# Patient Record
Sex: Male | Born: 1945 | Race: White | Hispanic: No | Marital: Married | Smoking: Never smoker
Health system: Southern US, Community
[De-identification: ages and names within clinical notes are randomized; demographics above are authoritative.]

## PROBLEM LIST (undated history)

## (undated) DIAGNOSIS — E785 Hyperlipidemia, unspecified: Secondary | ICD-10-CM

## (undated) DIAGNOSIS — I10 Essential (primary) hypertension: Secondary | ICD-10-CM

## (undated) DIAGNOSIS — L989 Disorder of the skin and subcutaneous tissue, unspecified: Secondary | ICD-10-CM

## (undated) HISTORY — PX: SKIN CANCER EXCISION: SHX779

## (undated) HISTORY — DX: Disorder of the skin and subcutaneous tissue, unspecified: L98.9

## (undated) HISTORY — DX: Essential (primary) hypertension: I10

## (undated) HISTORY — DX: Hyperlipidemia, unspecified: E78.5

---

## 2004-02-07 LAB — HM COLONOSCOPY

## 2004-06-20 ENCOUNTER — Ambulatory Visit: Payer: Self-pay | Admitting: Family Medicine

## 2004-06-28 ENCOUNTER — Ambulatory Visit: Payer: Self-pay | Admitting: Family Medicine

## 2005-07-08 ENCOUNTER — Ambulatory Visit: Payer: Self-pay | Admitting: Family Medicine

## 2005-07-15 ENCOUNTER — Ambulatory Visit: Payer: Self-pay | Admitting: Family Medicine

## 2006-07-08 ENCOUNTER — Ambulatory Visit: Payer: Self-pay | Admitting: Family Medicine

## 2006-07-08 LAB — CONVERTED CEMR LAB
AST: 36 units/L (ref 0–37)
Albumin: 4 g/dL (ref 3.5–5.2)
Basophils Relative: 0.9 % (ref 0.0–1.0)
CO2: 32 meq/L (ref 19–32)
Chloride: 100 meq/L (ref 96–112)
Cholesterol: 160 mg/dL (ref 0–200)
Creatinine, Ser: 1.1 mg/dL (ref 0.4–1.5)
Eosinophils Relative: 2.4 % (ref 0.0–5.0)
Glucose, Bld: 94 mg/dL (ref 70–99)
HCT: 39.3 % (ref 39.0–52.0)
Hemoglobin: 13.7 g/dL (ref 13.0–17.0)
LDL Cholesterol: 102 mg/dL — ABNORMAL HIGH (ref 0–99)
MCHC: 34.9 g/dL (ref 30.0–36.0)
Monocytes Absolute: 0.7 10*3/uL (ref 0.2–0.7)
Neutrophils Relative %: 39.8 % — ABNORMAL LOW (ref 43.0–77.0)
PSA: 0.33 ng/mL (ref 0.10–4.00)
Potassium: 3.7 meq/L (ref 3.5–5.1)
RBC: 4.38 M/uL (ref 4.22–5.81)
RDW: 12.7 % (ref 11.5–14.6)
Sodium: 139 meq/L (ref 135–145)
TSH: 1.25 microintl units/mL (ref 0.35–5.50)
Total Bilirubin: 1.2 mg/dL (ref 0.3–1.2)
Total CHOL/HDL Ratio: 4.2
Total Protein: 6.8 g/dL (ref 6.0–8.3)
VLDL: 20 mg/dL (ref 0–40)
WBC: 5.1 10*3/uL (ref 4.5–10.5)

## 2006-07-15 ENCOUNTER — Ambulatory Visit: Payer: Self-pay | Admitting: Family Medicine

## 2006-12-17 DIAGNOSIS — I10 Essential (primary) hypertension: Secondary | ICD-10-CM

## 2006-12-17 DIAGNOSIS — E785 Hyperlipidemia, unspecified: Secondary | ICD-10-CM | POA: Insufficient documentation

## 2007-06-25 ENCOUNTER — Telehealth: Payer: Self-pay | Admitting: Family Medicine

## 2007-07-16 ENCOUNTER — Ambulatory Visit: Payer: Self-pay | Admitting: Family Medicine

## 2007-07-16 LAB — CONVERTED CEMR LAB
Ketones, urine, test strip: NEGATIVE
Nitrite: NEGATIVE
Specific Gravity, Urine: 1.015
Urobilinogen, UA: 0.2
WBC Urine, dipstick: NEGATIVE

## 2007-07-17 LAB — CONVERTED CEMR LAB
ALT: 41 U/L
AST: 41 U/L — ABNORMAL HIGH
Albumin: 4.2 g/dL
Alkaline Phosphatase: 14 U/L — ABNORMAL LOW
BUN: 20 mg/dL
Basophils Absolute: 0 K/uL
Basophils Relative: 0.1 %
Bilirubin, Direct: 0.2 mg/dL
CO2: 28 meq/L
Calcium: 9.6 mg/dL
Chloride: 100 meq/L
Cholesterol: 148 mg/dL
Creatinine, Ser: 1.2 mg/dL
Eosinophils Absolute: 0.1 K/uL
Eosinophils Relative: 2.1 %
GFR calc Af Amer: 79 mL/min
GFR calc non Af Amer: 65 mL/min
Glucose, Bld: 92 mg/dL
HCT: 40.3 %
HDL: 32.6 mg/dL — ABNORMAL LOW
Hemoglobin: 13.3 g/dL
LDL Cholesterol: 103 mg/dL — ABNORMAL HIGH
Lymphocytes Relative: 43.4 %
MCHC: 33 g/dL
MCV: 92 fL
Monocytes Absolute: 0.7 K/uL
Monocytes Relative: 14.9 % — ABNORMAL HIGH
Neutro Abs: 1.9 K/uL
Neutrophils Relative %: 39.5 % — ABNORMAL LOW
PSA: 0.34 ng/mL
Platelets: 194 K/uL
Potassium: 4.6 meq/L
RBC: 4.38 M/uL
RDW: 13 %
Sodium: 137 meq/L
TSH: 1.81 u[IU]/mL
Total Bilirubin: 0.9 mg/dL
Total CHOL/HDL Ratio: 4.5
Total Protein: 6.7 g/dL
Triglycerides: 61 mg/dL
VLDL: 12 mg/dL
WBC: 4.7 10*3/microliter

## 2007-07-24 ENCOUNTER — Ambulatory Visit: Payer: Self-pay | Admitting: Family Medicine

## 2008-06-10 ENCOUNTER — Telehealth: Payer: Self-pay | Admitting: Family Medicine

## 2008-07-11 ENCOUNTER — Ambulatory Visit: Payer: Self-pay | Admitting: Family Medicine

## 2008-07-11 LAB — CONVERTED CEMR LAB
Blood in Urine, dipstick: NEGATIVE
Glucose, Urine, Semiquant: NEGATIVE
Nitrite: NEGATIVE
Protein, U semiquant: NEGATIVE
Urobilinogen, UA: 0.2
WBC Urine, dipstick: NEGATIVE
pH: 6

## 2008-07-15 LAB — CONVERTED CEMR LAB
AST: 42 units/L — ABNORMAL HIGH (ref 0–37)
Alkaline Phosphatase: 14 units/L — ABNORMAL LOW (ref 39–117)
BUN: 24 mg/dL — ABNORMAL HIGH (ref 6–23)
Basophils Absolute: 0 10*3/uL (ref 0.0–0.1)
Chloride: 104 meq/L (ref 96–112)
Eosinophils Absolute: 0.1 10*3/uL (ref 0.0–0.7)
Eosinophils Relative: 2.1 % (ref 0.0–5.0)
GFR calc non Af Amer: 80 mL/min
HDL: 34.8 mg/dL — ABNORMAL LOW (ref >39.0)
LDL Cholesterol: 112 mg/dL — ABNORMAL HIGH (ref 0–99)
MCV: 91.8 fL (ref 78.0–100.0)
Neutrophils Relative %: 39.1 % — ABNORMAL LOW (ref 43.0–77.0)
Platelets: 203 10*3/uL (ref 150–400)
Potassium: 3.8 meq/L (ref 3.5–5.1)
Total Bilirubin: 0.9 mg/dL (ref 0.3–1.2)
VLDL: 18 mg/dL (ref 0–40)
WBC: 5.6 10*3/uL (ref 4.5–10.5)

## 2008-07-18 ENCOUNTER — Ambulatory Visit: Payer: Self-pay | Admitting: Family Medicine

## 2008-07-18 DIAGNOSIS — M7710 Lateral epicondylitis, unspecified elbow: Secondary | ICD-10-CM

## 2009-07-03 ENCOUNTER — Telehealth: Payer: Self-pay | Admitting: Family Medicine

## 2009-07-19 ENCOUNTER — Ambulatory Visit: Payer: Self-pay | Admitting: Family Medicine

## 2009-07-21 ENCOUNTER — Telehealth: Payer: Self-pay | Admitting: Family Medicine

## 2009-08-21 ENCOUNTER — Ambulatory Visit: Payer: Self-pay | Admitting: Family Medicine

## 2009-08-21 DIAGNOSIS — M25569 Pain in unspecified knee: Secondary | ICD-10-CM

## 2009-10-05 ENCOUNTER — Telehealth (INDEPENDENT_AMBULATORY_CARE_PROVIDER_SITE_OTHER): Payer: Self-pay

## 2009-11-13 ENCOUNTER — Encounter (INDEPENDENT_AMBULATORY_CARE_PROVIDER_SITE_OTHER): Payer: Self-pay | Admitting: *Deleted

## 2009-12-11 ENCOUNTER — Encounter (INDEPENDENT_AMBULATORY_CARE_PROVIDER_SITE_OTHER): Payer: Self-pay | Admitting: *Deleted

## 2009-12-14 ENCOUNTER — Ambulatory Visit: Payer: Self-pay | Admitting: Internal Medicine

## 2009-12-28 ENCOUNTER — Ambulatory Visit: Payer: Self-pay | Admitting: Internal Medicine

## 2009-12-28 HISTORY — PX: OTHER SURGICAL HISTORY: SHX169

## 2009-12-28 LAB — HM COLONOSCOPY

## 2010-01-05 ENCOUNTER — Ambulatory Visit: Payer: Self-pay | Admitting: Family Medicine

## 2010-01-09 ENCOUNTER — Encounter: Payer: Self-pay | Admitting: Family Medicine

## 2010-05-31 ENCOUNTER — Telehealth (INDEPENDENT_AMBULATORY_CARE_PROVIDER_SITE_OTHER): Payer: Self-pay | Admitting: *Deleted

## 2010-05-31 ENCOUNTER — Telehealth: Payer: Self-pay

## 2010-06-24 LAB — CONVERTED CEMR LAB
ALT: 37 units/L (ref 0–53)
BUN: 21 mg/dL (ref 6–23)
Basophils Absolute: 0 10*3/uL (ref 0.0–0.1)
Bilirubin Urine: NEGATIVE
Bilirubin, Direct: 0.2 mg/dL (ref 0.0–0.3)
Chloride: 104 meq/L (ref 96–112)
Cholesterol: 152 mg/dL (ref 0–200)
Creatinine, Ser: 1 mg/dL (ref 0.4–1.5)
Eosinophils Relative: 3.1 % (ref 0.0–5.0)
GFR calc non Af Amer: 79.69 mL/min (ref >60)
Ketones, urine, test strip: NEGATIVE
LDL Cholesterol: 91 mg/dL (ref 0–99)
MCV: 95.1 fL (ref 78.0–100.0)
Monocytes Absolute: 0.6 10*3/uL (ref 0.1–1.0)
Neutrophils Relative %: 42.1 % — ABNORMAL LOW (ref 43.0–77.0)
PSA: 0.56 ng/mL (ref 0.10–4.00)
Platelets: 198 10*3/uL (ref 150.0–400.0)
Protein, U semiquant: NEGATIVE
Total Bilirubin: 0.8 mg/dL (ref 0.3–1.2)
Triglycerides: 83 mg/dL (ref 0.0–149.0)
Urobilinogen, UA: 0.2
VLDL: 16.6 mg/dL (ref 0.0–40.0)
WBC: 4.4 10*3/uL — ABNORMAL LOW (ref 4.5–10.5)

## 2010-06-26 NOTE — Assessment & Plan Note (Signed)
Summary: fu on knee/njr   Vital Signs:  Patient profile:   65 year old male Weight:      192 pounds BMI:     29.52 BP sitting:   172 / 84  (left arm) Cuff size:   regular  Vitals Entered By: Raechel Ache, RN (January 05, 2010 9:59 AM) CC: F/u L knee pain- little worse.   History of Present Illness: Here for continuing pain and swelling in the left knee, primarily on the medial side. This has been going on for 5 months. Etodolac and Advil help but only briefly.   Allergies: No Known Drug Allergies  Past History:  Past Medical History: Reviewed history from 07/18/2008 and no changes required. Hyperlipidemia Hypertension sees  Dr. Danella Deis for skin checks  Past Surgical History: Reviewed history from 07/24/2007 and no changes required. Colonoscopy-02/07/2004, per Dr. Juanda Chance, repeat 5 yrs Skin cancer removed rt cheek  Review of Systems  The patient denies anorexia, fever, weight loss, weight gain, vision loss, decreased hearing, hoarseness, chest pain, syncope, dyspnea on exertion, peripheral edema, prolonged cough, headaches, hemoptysis, abdominal pain, melena, hematochezia, severe indigestion/heartburn, hematuria, incontinence, genital sores, muscle weakness, suspicious skin lesions, transient blindness, difficulty walking, depression, unusual weight change, abnormal bleeding, enlarged lymph nodes, angioedema, breast masses, and testicular masses.    Physical Exam  General:  Well-developed,well-nourished,in no acute distress; alert,appropriate and cooperative throughout examination Extremities:  the left knee is puffy and tender at the medial joint space. Lots of crepitus. Full ROM   Impression & Recommendations:  Problem # 1:  KNEE PAIN (ICD-719.46)  His updated medication list for this problem includes:    Aspirin 81 Mg Tbec (Aspirin) ..... One by mouth every day    Etodolac 500 Mg Tabs (Etodolac) .Marland Kitchen..Marland Kitchen Two times a day  Orders: Orthopedic Surgeon Referral (Ortho  Surgeon)  Complete Medication List: 1)  Diovan Hct 320-25 Mg Tabs (Valsartan-hydrochlorothiazide) .... Take 1 tab daily 2)  Tricor 145 Mg Tabs (Fenofibrate) .Marland Kitchen.. 1 by mouth once daily 3)  Norvasc 5 Mg Tabs (Amlodipine besylate) .Marland Kitchen.. 1 by mouth once daily 4)  Fish Oil Oil (Fish oil) .Marland Kitchen.. 1 by mouth once daily 5)  Multivitamins Tabs (Multiple vitamin) .Marland Kitchen.. 1 by mouth once daily 6)  Aspirin 81 Mg Tbec (Aspirin) .... One by mouth every day 7)  Lipitor 20 Mg Tabs (Atorvastatin calcium) .... Once daily 8)  Etodolac 500 Mg Tabs (Etodolac) .... Two times a day  Patient Instructions: 1)  Will refer him to Orthopedics.

## 2010-06-26 NOTE — Procedures (Signed)
Summary: Colonoscopy  Patient: [name] Note: All result statuses are Final unless otherwise noted.  Tests: (1) Colonoscopy (COL)   COL Colonoscopy           DONE     [facility]     [address]     [place], [place]  [address]           COLONOSCOPY PROCEDURE REPORT           PATIENT:  [name], [name]  MR#:  [id number]     BIRTHDATE:  May 01, 1946, 64 yrs. old  GENDER:  male     ENDOSCOPIST:  Hedwig Morton. Juanda Chance, MD     REF. BY:     PROCEDURE DATE:  12/28/2009     PROCEDURE:  Colonoscopy 29562     ASA CLASS:  Class II     INDICATIONS:  surveillance     MEDICATIONS:   Versed 5 mg, Fentanyl 50 mcg           DESCRIPTION OF PROCEDURE:   After the risks benefits and     alternatives of the procedure were thoroughly explained, informed     consent was obtained.  No rectal exam performed. The LB PCF-H180AL     [id number] endoscope was introduced through the anus and advanced to     the cecum, which was identified by both the appendix and ileocecal     valve, without limitations.  The quality of the prep was good,     using MiraLax.  The instrument was then slowly withdrawn as the     colon was fully examined.     <<PROCEDUREIMAGES>>           FINDINGS:  Mild diverticulosis was found in the sigmoid colon (see     image1).  This was otherwise a normal examination of the colon     (see image2, image3, image4, and image8).   Retroflexed views in     the rectum revealed no abnormalities.    The scope was then     withdrawn from the patient and the procedure completed.           COMPLICATIONS:  None     ENDOSCOPIC IMPRESSION:     1) Mild diverticulosis in the sigmoid colon     2) Otherwise normal examination     RECOMMENDATIONS:     1) high fiber diet     REPEAT EXAM:  In 10 year(s) for.           ______________________________     Hedwig Morton. Juanda Chance, MD           CC:           n.     eSIGNED:   Hedwig Morton. [name] at 12/28/2009 10:33 AM           Mickel Baas, [id number]  Note:  An exclamation mark (!) indicates a result that was not dispersed into the flowsheet. Document Creation Date: 12/28/2009 10:34 AM _______________________________________________________________________  (1) Order result status: Final Collection or observation date-time: 12/28/2009 10:21 Requested date-time:  Receipt date-time:  Reported date-time:  Referring Physician:   Ordering Physician: Lina Sar [id number]) Specimen Source:  Source: Launa Grill Order Number: [id number] Lab site:   Appended Document: Colonoscopy    Clinical Lists Changes  Observations: Added new observation of COLONNXTDUE: 12/2019 (12/28/2009 14:06)

## 2010-06-26 NOTE — Miscellaneous (Signed)
Summary: direct colon//hx of polyps--ch.  Clinical Lists Changes  Medications: Added new medication of MIRALAX   POWD (POLYETHYLENE GLYCOL 3350) As directed - Signed Added new medication of REGLAN 10 MG  TABS (METOCLOPRAMIDE HCL) As directed - Signed Added new medication of DULCOLAX 5 MG  TBEC (BISACODYL) As directed - Signed Rx of MIRALAX   POWD (POLYETHYLENE GLYCOL 3350) As directed;  #255 x 0;  Signed;  Entered by: Clide Cliff RN;  Authorized by: Hart Carwin MD;  Method used: [facility]  [phone]*, [address] [place], [place]  [address], Ph: [phone] or [phone], Fax: [phone] Rx of REGLAN 10 MG  TABS (METOCLOPRAMIDE HCL) As directed;  #2 x 0;  Signed;  Entered by: Clide Cliff RN;  Authorized by: Hart Carwin MD;  Method used: Electronically to [facility]  [phone]*, [address], [place], [place]  [address], Ph: [phone] or [phone], Fax: [phone] Rx of DULCOLAX 5 MG  TBEC (BISACODYL) As directed;  #4 x 0;  Signed;  Entered by: Clide Cliff RN;  Authorized by: Hart Carwin MD;  Method used: Electronically to [facility]  [phone]*, [address], [place], [place]  [address], Ph: [phone] or [phone], Fax: [phone] Observations: Added new observation of ALLERGY REV: Done (12/14/2009 8:21)    Prescriptions: DULCOLAX 5 MG  TBEC (BISACODYL) As directed  #4 x 0   Entered by:   Clide Cliff RN   Authorized by:   Hart Carwin MD   Signed by:   Clide Cliff RN on 12/14/2009   Method used:   Electronically to        [facility]  [phone]* (retail)       [address] [place], [place]  [address]       Ph: [phone] or [phone]       Fax: [phone]   RxID:   [id number] REGLAN 10 MG  TABS (METOCLOPRAMIDE HCL) As directed  #2 x 0   Entered by:   Clide Cliff RN   Authorized by:   Hart Carwin MD   Signed by:   Clide Cliff RN on 12/14/2009   Method used:   Electronically to      [facility]  [phone]* (retail)       [address] [place], [place]  [address]       Ph: [phone] or [phone]       Fax: [phone]   RxID:   [id number] MIRALAX   POWD (POLYETHYLENE GLYCOL 3350) As directed  #255 x 0   Entered by:   Clide Cliff RN   Authorized by:   Hart Carwin MD   Signed by:   Clide Cliff RN on 12/14/2009   Method used:   Electronically to        [facility]  [phone]* (retail)       [address] [place], [place]  [address]       Ph: [phone] or [phone]       Fax: [phone]   RxID:   [id number]

## 2010-06-26 NOTE — Progress Notes (Signed)
Summary: REQ FOR LAB RESULTS  Phone Note Call from Patient   Caller: Patient [phone] Reason for Call: Talk to Nurse Summary of Call: Pt called to obtain results of labwork..... Request return call at [phone].  Initial call taken by: Debbra Riding,  July 21, 2009 1:57 PM  Follow-up for Phone Call        pt aware of normal lab results Follow-up by: Alfred Levins, CMA,  July 21, 2009 2:46 PM

## 2010-06-26 NOTE — Letter (Signed)
Summary: Previsit letter  [facility]  [address] [place], [place] [address]   Phone: [phone]  Fax: [phone]       11/13/2009 MRN: [id number]  [facility] [address] [place], [place]  [address]  Dear [name],  Welcome to the Gastroenterology Division at [facility].    You are scheduled to see a nurse for your pre-procedure visit on 12-14-09 at 8:30a.m. on the 3rd floor at [facility], [address]  We ask that you try to arrive at our office 15 minutes prior to your appointment time to allow for check-in.  Your nurse visit will consist of discussing your medical and surgical history, your immediate family medical history, and your medications.    Please bring a complete list of all your medications or, if you prefer, bring the medication bottles and we will list them.  We will need to be aware of both prescribed and over the counter drugs.  We will need to know exact dosage information as well.  If you are on blood thinners (Coumadin, Plavix, Aggrenox, Ticlid, etc.) please call our office today/prior to your appointment, as we need to consult with your physician about holding your medication.   Please be prepared to read and sign documents such as consent forms, a financial agreement, and acknowledgement forms.  If necessary, and with your consent, a friend or relative is welcome to sit-in on the nurse visit with you.  Please bring your insurance card so that we may make a copy of it.  If your insurance requires a referral to see a specialist, please bring your referral form from your primary care physician.  No co-pay is required for this nurse visit.     If you cannot keep your appointment, please call [phone] to cancel or reschedule prior to your appointment date.  This allows [place] the opportunity to schedule an appointment for another patient in need of care.    Thank you for choosing [facility] for your medical  needs.  We appreciate the opportunity to care for you.  Please visit [place] at our website  to learn more about our practice.                     Sincerely.                                                                                                                   The Gastroenterology Division

## 2010-06-26 NOTE — Progress Notes (Signed)
Summary: AAA Referall  Phone Note Call from Patient Call back at Home Phone [phone]   Caller: Spouse - Joann Summary of Call: Pt reading through Medicare paperwork and sees that Welcome to Medicare Exam will cover a referrall for an abdominal aortic aneurysm ultrasound for at risk patients.  Does pt need this referall?  Is he at risk? Initial call taken by: Trixie Dredge,  Oct 05, 2009 4:47 PM  Follow-up for Phone Call        his only risk factor would be HTN, but we could talk about this. Set up an OV next week to discuss this (not a Medicare Wellness visit) Follow-up by: Nelwyn Salisbury MD,  Oct 06, 2009 8:40 AM  Additional Follow-up for Phone Call Additional follow up Details #1::        attempted to call back - ans mach - LMTCB and make appt - f/u  - not a medicare wellness visit - to discuss. KIK Additional Follow-up by: Duard Brady LPN,  Oct 06, 2009 1:40 PM

## 2010-06-26 NOTE — Assessment & Plan Note (Signed)
Summary: Pt injured lft knee/swelling/pain/cjr   Vital Signs:  Patient profile:   65 year old male Weight:      195 pounds Temp:     98.3 degrees F oral BP sitting:   142 / 88  (left arm) Cuff size:   regular  Vitals Entered By: Raechel Ache, RN (August 21, 2009 11:54 AM) CC: C/o L knee pain and swelling.   History of Present Illness: Here for 3 weeks of pain in the left knee with no trauma hx. He has been running for exercise for several years, averaging around 3 miles a day. The pain is worst at the end of the day, especially if he has been standing or walking a lot. The knee swells at times. Using Advil.   Allergies (verified): No Known Drug Allergies  Past History:  Past Medical History: Reviewed history from 07/18/2008 and no changes required. Hyperlipidemia Hypertension sees  Dr. Danella Deis for skin checks  Past Surgical History: Reviewed history from 07/24/2007 and no changes required. Colonoscopy-02/07/2004, per Dr. Juanda Chance, repeat 5 yrs Skin cancer removed rt cheek  Review of Systems  The patient denies anorexia, fever, weight loss, weight gain, vision loss, decreased hearing, hoarseness, chest pain, syncope, dyspnea on exertion, peripheral edema, prolonged cough, headaches, hemoptysis, abdominal pain, melena, hematochezia, severe indigestion/heartburn, hematuria, incontinence, genital sores, muscle weakness, suspicious skin lesions, transient blindness, difficulty walking, depression, unusual weight change, abnormal bleeding, enlarged lymph nodes, angioedema, breast masses, and testicular masses.    Physical Exam  General:  Well-developed,well-nourished,in no acute distress; alert,appropriate and cooperative throughout examination Msk:  tender around the medial and lateral edges of the left patella, no edema. Full ROM. No crepitus. Negative McMurrays and anterior drawer.    Impression & Recommendations:  Problem # 1:  KNEE PAIN (ICD-719.46)  His updated medication  list for this problem includes:    Aspirin 81 Mg Tbec (Aspirin) ..... One by mouth every day    Etodolac 500 Mg Tabs (Etodolac) .Marland Kitchen..Marland Kitchen Two times a day  Complete Medication List: 1)  Diovan Hct 320-25 Mg Tabs (Valsartan-hydrochlorothiazide) .... Take 1 tab daily 2)  Tricor 145 Mg Tabs (Fenofibrate) .Marland Kitchen.. 1 by mouth once daily 3)  Norvasc 5 Mg Tabs (Amlodipine besylate) .Marland Kitchen.. 1 by mouth once daily 4)  Fish Oil Oil (Fish oil) .Marland Kitchen.. 1 by mouth once daily 5)  Multivitamins Tabs (Multiple vitamin) .Marland Kitchen.. 1 by mouth once daily 6)  Aspirin 81 Mg Tbec (Aspirin) .... One by mouth every day 7)  Lipitor 20 Mg Tabs (Atorvastatin calcium) .... Once daily 8)  Etodolac 500 Mg Tabs (Etodolac) .... Two times a day  Patient Instructions: 1)  This is consistent with patello-femoral syndrome. Stop all running for awhile. Use Etodolac and ice as needed .  2)  Please schedule a follow-up appointment as needed .  Prescriptions: ETODOLAC 500 MG TABS (ETODOLAC) two times a day  #60 x 2   Entered and Authorized by:   Nelwyn Salisbury MD   Signed by:   Nelwyn Salisbury MD on 08/21/2009   Method used:   Electronically to        [facility]  [phone]* (retail)       [address] [place], [place]  [address]       Ph: [phone] or [phone]       Fax: [phone]   RxID:   [id number]

## 2010-06-26 NOTE — Progress Notes (Signed)
Summary: Pt req 90 day supply script for Diovan HCT 320-25mg  [facility]  Phone Note Call from Patient Call back at [facility] Phone [phone]   Caller: Patient Summary of Call: Pt req 90 day supply of Diovan HCT 320/25 mg. Please call pt when script is ready for pick up and patient will mail script to [facility].  Initial call taken by: Lucy Antigua,  July 03, 2009 9:19 AM  Follow-up for Phone Call        rx up front ready for p/u, pt aware Follow-up by: Alfred Levins, CMA,  July 03, 2009 4:03 PM    Prescriptions: DIOVAN HCT 320-25 MG TABS (VALSARTAN-HYDROCHLOROTHIAZIDE) Take 1 tab daily  #90 x 3   Entered by:   Alfred Levins, CMA   Authorized by:   Nelwyn Salisbury MD   Signed by:   Alfred Levins, CMA on 07/03/2009   Method used:   Print then Give to Patient   RxID:   [id number]

## 2010-06-26 NOTE — Letter (Signed)
Summary: Miralax Instructions  [facility]  [address]   [place], [place] [address]   Phone: [phone]  Fax: [phone]       Zaivion Deakins    [birth year]    MRN: [id number]       Procedure Day Dorna Bloom: Thursday, 12-28-09     Arrival Time: 8:30 a.m.     Procedure Time: 9:30 a.m.     Location of Procedure:                    x    [facility]oscopy Center (4th Floor)    PREPARATION FOR COLONOSCOPY WITH MIRALAX  Starting 5 days prior to your procedure 12-24-09  do not eat nuts, seeds, popcorn, corn, beans, peas,  salads, or any raw vegetables.  Do not take any fiber supplements (e.g. Metamucil, Citrucel, and Benefiber). ____________________________________________________________________________________________________   THE DAY BEFORE YOUR PROCEDURE         DATE: 12-27-09  DAY: Wednesday  1   Drink clear liquids the entire day-NO SOLID FOOD  2   Do not drink anything colored red or purple.  Avoid juices with pulp.  No orange juice.  3   Drink at least 64 oz. (8 glasses) of fluid/clear liquids during the day to prevent dehydration and help the prep work efficiently.  CLEAR LIQUIDS INCLUDE: Water Jello Ice Popsicles Tea (sugar ok, no milk/cream) Powdered fruit flavored drinks Coffee (sugar ok, no milk/cream) Gatorade Juice: apple, white grape, white cranberry  Lemonade Clear bullion, consomm, broth Carbonated beverages (any kind) Strained chicken noodle soup Hard Candy  4   Mix the entire bottle of Miralax with 64 oz. of Gatorade/Powerade in the morning and put in the refrigerator to chill.  5   At 3:00 pm take 2 Dulcolax/Bisacodyl tablets.  6   At 4:30 pm take one Reglan/Metoclopramide tablet.  7  Starting at 5:00 pm drink one 8 oz glass of the Miralax mixture every 15-20 minutes until you have finished drinking the entire 64 oz.  You should finish drinking prep around 7:30 or 8:00 pm.  8   If you are nauseated, you may take the 2nd  Reglan/Metoclopramide tablet at 6:30 pm.        9    At 8:00 pm take 2 more DULCOLAX/Bisacodyl tablets.     THE DAY OF YOUR PROCEDURE      DATE: 12-28-09  DAY: Thursday  You may drink clear liquids until 7:30 a.m.  (2 HOURS BEFORE PROCEDURE).   MEDICATION INSTRUCTIONS  Unless otherwise instructed, you should take regular prescription medications with a small sip of water as early as possible the morning of your procedure.   Additional medication instructions: _ Do not take your hctz bp med the am of your procedure.         OTHER INSTRUCTIONS  You will need a responsible adult at least 66 years of age to accompany you and drive you home.   This person must remain in the waiting room during your procedure.  Wear loose fitting clothing that is easily removed.  Leave jewelry and other valuables at home.  However, you may wish to bring a book to read or an iPod/MP3 player to listen to music as you wait for your procedure to start.  Remove all body piercing jewelry and leave at home.  Total time from sign-in until discharge is approximately 2-3 hours.  You should go home directly after your procedure and rest.  You can resume normal activities  the day after your procedure.  The day of your procedure you should not:   Drive   Make legal decisions   Operate machinery   Drink alcohol   Return to work  You will receive specific instructions about eating, activities and medications before you leave.   The above instructions have been reviewed and explained to me by   Clide Cliff, RN______________________    I fully understand and can verbalize these instructions _____________________________ Date _______

## 2010-06-26 NOTE — Assessment & Plan Note (Signed)
Summary: emp-will fast//ccm   Vital Signs:  Patient profile:   64 year old male Height:      67.75 inches Weight:      190 pounds BMI:     29.21 Temp:     98.1 degrees F oral Pulse rate:   68 / minute BP sitting:   138 / 72  (left arm) Cuff size:   large  Vitals Entered By: Alfred Levins, CMA (July 19, 2009 9:02 AM) CC: cpx, fasting   History of Present Illness: 65 yr old male for a cpx. He feels good and has no concerns. He would like to have a shingles vaccine. he is still active, lifts weights 3 days a week, and walks 3.2 miles every day.   Current Medications (verified): 1)  Diovan Hct 320-25 Mg Tabs (Valsartan-Hydrochlorothiazide) .... Take 1 Tab Daily 2)  Tricor 145 Mg Tabs (Fenofibrate) .Marland Kitchen.. 1 By Mouth Once Daily 3)  Norvasc 5 Mg  Tabs (Amlodipine Besylate) .Marland Kitchen.. 1 By Mouth Once Daily 4)  Fish Oil   Oil (Fish Oil) .Marland Kitchen.. 1 By Mouth Once Daily 5)  Multivitamins   Tabs (Multiple Vitamin) .Marland Kitchen.. 1 By Mouth Once Daily 6)  Aspirin 81 Mg  Tbec (Aspirin) .... One By Mouth Every Day 7)  Lipitor 20 Mg Tabs (Atorvastatin Calcium) .... Once Daily  Allergies (verified): No Known Drug Allergies  Past History:  Past Medical History: Reviewed history from 07/18/2008 and no changes required. Hyperlipidemia Hypertension sees  Dr. Danella Deis for skin checks  Past Surgical History: Reviewed history from 07/24/2007 and no changes required. Colonoscopy-02/07/2004, per Dr. Juanda Chance, repeat 5 yrs Skin cancer removed rt cheek  Family History: Reviewed history from 07/24/2007 and no changes required. unremarkable  Social History: Reviewed history from 07/24/2007 and no changes required. Alcohol use-yes Drug use-no Married Never Smoked  Review of Systems  The patient denies anorexia, fever, weight loss, weight gain, vision loss, decreased hearing, hoarseness, chest pain, syncope, dyspnea on exertion, peripheral edema, prolonged cough, headaches, hemoptysis, abdominal pain, melena,  hematochezia, severe indigestion/heartburn, hematuria, incontinence, genital sores, muscle weakness, suspicious skin lesions, transient blindness, difficulty walking, depression, unusual weight change, abnormal bleeding, enlarged lymph nodes, angioedema, breast masses, and testicular masses.    Physical Exam  General:  overweight-appearing.   Head:  Normocephalic and atraumatic without obvious abnormalities. No apparent alopecia or balding. Eyes:  No corneal or conjunctival inflammation noted. EOMI. Perrla. Funduscopic exam benign, without hemorrhages, exudates or papilledema. Vision grossly normal. Ears:  External ear exam shows no significant lesions or deformities.  Otoscopic examination reveals clear canals, tympanic membranes are intact bilaterally without bulging, retraction, inflammation or discharge. Hearing is grossly normal bilaterally. Nose:  External nasal examination shows no deformity or inflammation. Nasal mucosa are pink and moist without lesions or exudates. Mouth:  Oral mucosa and oropharynx without lesions or exudates.  Teeth in good repair. Neck:  No deformities, masses, or tenderness noted. Chest Wall:  No deformities, masses, tenderness or gynecomastia noted. Lungs:  Normal respiratory effort, chest expands symmetrically. Lungs are clear to auscultation, no crackles or wheezes. Heart:  Normal rate and regular rhythm. S1 and S2 normal without gallop, murmur, click, rub or other extra sounds. EKG normal with an occasional PAC Abdomen:  Bowel sounds positive,abdomen soft and non-tender without masses, organomegaly or hernias noted. Rectal:  No external abnormalities noted. Normal sphincter tone. No rectal masses or tenderness. Heme neg. Genitalia:  Testes bilaterally descended without nodularity, tenderness or masses. No scrotal masses or lesions. No penis  lesions or urethral discharge. Prostate:  Prostate gland firm and smooth, no enlargement, nodularity, tenderness, mass,  asymmetry or induration. Msk:  No deformity or scoliosis noted of thoracic or lumbar spine.   Pulses:  R and L carotid,radial,femoral,dorsalis pedis and posterior tibial pulses are full and equal bilaterally Extremities:  No clubbing, cyanosis, edema, or deformity noted with normal full range of motion of all joints.   Neurologic:  No cranial nerve deficits noted. Station and gait are normal. Plantar reflexes are down-going bilaterally. DTRs are symmetrical throughout. Sensory, motor and coordinative functions appear intact. Skin:  Intact without suspicious lesions or rashes Cervical Nodes:  No lymphadenopathy noted Axillary Nodes:  No palpable lymphadenopathy Inguinal Nodes:  No significant adenopathy Psych:  Cognition and judgment appear intact. Alert and cooperative with normal attention span and concentration. No apparent delusions, illusions, hallucinations   Impression & Recommendations:  Problem # 1:  WELL ADULT EXAM (ICD-V70.0)  Orders: Hemoccult Guaiac-1 spec.(in office) (82270) EKG w/ Interpretation (93000) UA Dipstick w/o Micro (automated)  (81003) Venipuncture (24401) TLB-Lipid Panel (80061-LIPID) TLB-BMP (Basic Metabolic Panel-BMET) (80048-METABOL) TLB-CBC Platelet - w/Differential (85025-CBCD) TLB-Hepatic/Liver Function Pnl (80076-HEPATIC) TLB-TSH (Thyroid Stimulating Hormone) (84443-TSH) TLB-PSA (Prostate Specific Antigen) (84153-PSA)  Complete Medication List: 1)  Diovan Hct 320-25 Mg Tabs (Valsartan-hydrochlorothiazide) .... Take 1 tab daily 2)  Tricor 145 Mg Tabs (Fenofibrate) .Marland Kitchen.. 1 by mouth once daily 3)  Norvasc 5 Mg Tabs (Amlodipine besylate) .Marland Kitchen.. 1 by mouth once daily 4)  Fish Oil Oil (Fish oil) .Marland Kitchen.. 1 by mouth once daily 5)  Multivitamins Tabs (Multiple vitamin) .Marland Kitchen.. 1 by mouth once daily 6)  Aspirin 81 Mg Tbec (Aspirin) .... One by mouth every day 7)  Lipitor 20 Mg Tabs (Atorvastatin calcium) .... Once daily  Other Orders: Zoster (Shingles) Vaccine Live  [phone]) Admin 1st Vaccine (36644)  Patient Instructions: 1)  Get labs.  2)  Schedule a colonoscopy/ sigmoidoscopy to help detect colon cancer.  Prescriptions: LIPITOR 20 MG TABS (ATORVASTATIN CALCIUM) once daily  #90 x 3   Entered and Authorized by:   Nelwyn Salisbury MD   Signed by:   Nelwyn Salisbury MD on 07/19/2009   Method used:   Print then Give to Patient   RxID:   [id number] NORVASC 5 MG  TABS (AMLODIPINE BESYLATE) 1 by mouth once daily  #90 x 3   Entered and Authorized by:   Nelwyn Salisbury MD   Signed by:   Nelwyn Salisbury MD on 07/19/2009   Method used:   Print then Give to Patient   RxID:   [id number] TRICOR 145 MG TABS (FENOFIBRATE) 1 by mouth once daily  #90 x 3   Entered and Authorized by:   Nelwyn Salisbury MD   Signed by:   Nelwyn Salisbury MD on 07/19/2009   Method used:   Print then Give to Patient   RxID:   [id number]   Preventive Care Screening  Colonoscopy:    Date:  01/25/2005    Next Due:  01/2010    Results:  normal    Laboratory Results   Urine Tests    Routine Urinalysis   Color: yellow Appearance: Clear Glucose: negative   (Normal Range: Negative) Bilirubin: negative   (Normal Range: Negative) Ketone: negative   (Normal Range: Negative) Spec. Gravity: 1.015   (Normal Range: 1.003-1.035) Blood: negative   (Normal Range: Negative) pH: 6.0   (Normal Range: 5.0-8.0) Protein: negative   (Normal Range: Negative) Urobilinogen: 0.2   (Normal Range:  0-1) Nitrite: negative   (Normal Range: Negative) Leukocyte Esterace: negative   (Normal Range: Negative)    Comments: Rita Ohara  July 19, 2009 10:40 AM      Immunizations Administered:  Zostavax # 1:    Vaccine Type: Zostavax    Site: right arm    Mfr: Merck    Dose: 0.5 ml    Route: [place]    Given by: Alfred Levins, CMA    Exp. Date: 06/14/2010    Lot #: [id number]

## 2010-06-28 NOTE — Progress Notes (Signed)
Summary: Needs Med Refill Until CPX  Phone Note Refill Request Message from:  Patient on May 31, 2010 10:42 AM  Refills Requested: Medication #1:  DIOVAN HCT 320-25 MG TABS Take 1 tab daily Has an appt 07/24/10 for cpx, however will run out of meds before then.  Needs to have rx for diovan called in.  Initial call taken by: Trixie Dredge,  May 31, 2010 10:41 AM    Prescriptions: DIOVAN HCT 320-25 MG TABS (VALSARTAN-HYDROCHLOROTHIAZIDE) Take 1 tab daily  #90 x 0   Entered by:   Jeremy Johann CMA   Authorized by:   Nelwyn Salisbury MD   Signed by:   Jeremy Johann CMA on 05/31/2010   Method used:   Faxed to ...       [facility] ([facility]-order)       [address] [place], [place]  [address]       Ph: [phone]       Fax: [phone]   RxID:   [id number]

## 2010-06-28 NOTE — Progress Notes (Signed)
Summary: refill  Phone Note Refill Request Message from:  Fax from Pharmacy  Refills Requested: Medication #1:  TRICOR 145 MG TABS 1 by mouth once daily  Medication #2:  NORVASC 5 MG  TABS 1 by mouth once daily  Medication #3:  LIPITOR 20 MG TABS once daily [facility].................Marland KitchenFelecia Stewart CMA  May 31, 2010 11:51 AM      Prescriptions: LIPITOR 20 MG TABS (ATORVASTATIN CALCIUM) once daily  #90 x 0   Entered by:   Jeremy Johann CMA   Authorized by:   Nelwyn Salisbury MD   Signed by:   Jeremy Johann CMA on 05/31/2010   Method used:   Faxed to ...       [facility] (mail-order)       [address] [place], [place]  [address]       Ph: [phone]       Fax: [phone]   RxID:   [id number] NORVASC 5 MG  TABS (AMLODIPINE BESYLATE) 1 by mouth once daily  #90 x 0   Entered by:   Jeremy Johann CMA   Authorized by:   Nelwyn Salisbury MD   Signed by:   Jeremy Johann CMA on 05/31/2010   Method used:   Faxed to ...       [facility] (mail-order)       [address] [place], [place]  [address]       Ph: [phone]       Fax: [phone]   RxID:   [id number] TRICOR 145 MG TABS (FENOFIBRATE) 1 by mouth once daily  #90 x 0   Entered by:   Jeremy Johann CMA   Authorized by:   Nelwyn Salisbury MD   Signed by:   Jeremy Johann CMA on 05/31/2010   Method used:   Faxed to ...       [facility] (mail-order)       [address] [place], [place]  [address]       Ph: [phone]       Fax: [phone]   RxID:   [id number]

## 2010-06-29 ENCOUNTER — Encounter: Payer: Self-pay | Admitting: Family Medicine

## 2010-06-29 NOTE — Consult Note (Signed)
Summary: [facility]  [facility]   Imported By: Maryln Gottron 01/15/2010 10:57:37  _____________________________________________________________________  External Attachment:    Type:   Image     Comment:   External Document

## 2010-07-03 ENCOUNTER — Encounter: Payer: Self-pay | Admitting: Family Medicine

## 2010-07-24 ENCOUNTER — Ambulatory Visit (INDEPENDENT_AMBULATORY_CARE_PROVIDER_SITE_OTHER): Payer: Medicare Other | Admitting: Family Medicine

## 2010-07-24 ENCOUNTER — Encounter: Payer: Self-pay | Admitting: Family Medicine

## 2010-07-24 VITALS — BP 160/80 | HR 75 | Temp 98.1°F | Ht 68.0 in | Wt 189.0 lb

## 2010-07-24 DIAGNOSIS — I10 Essential (primary) hypertension: Secondary | ICD-10-CM

## 2010-07-24 DIAGNOSIS — N139 Obstructive and reflux uropathy, unspecified: Secondary | ICD-10-CM

## 2010-07-24 DIAGNOSIS — N401 Enlarged prostate with lower urinary tract symptoms: Secondary | ICD-10-CM

## 2010-07-24 DIAGNOSIS — N138 Other obstructive and reflux uropathy: Secondary | ICD-10-CM

## 2010-07-24 DIAGNOSIS — S15009A Unspecified injury of unspecified carotid artery, initial encounter: Secondary | ICD-10-CM

## 2010-07-24 DIAGNOSIS — E785 Hyperlipidemia, unspecified: Secondary | ICD-10-CM

## 2010-07-24 LAB — HEPATIC FUNCTION PANEL
Albumin: 4.3 g/dL (ref 3.5–5.2)
Alkaline Phosphatase: 23 U/L — ABNORMAL LOW (ref 39–117)
Total Protein: 6.9 g/dL (ref 6.0–8.3)

## 2010-07-24 LAB — LIPID PANEL
Cholesterol: 148 mg/dL (ref 0–200)
HDL: 43.7 mg/dL (ref >39.00)
LDL Cholesterol: 92 mg/dL (ref 0–99)
Triglycerides: 60 mg/dL (ref 0.0–149.0)
VLDL: 12 mg/dL (ref 0.0–40.0)

## 2010-07-24 LAB — POCT URINALYSIS DIPSTICK
Bilirubin, UA: NEGATIVE
Blood, UA: NEGATIVE
Glucose, UA: NEGATIVE
Leukocytes, UA: NEGATIVE
Nitrite, UA: NEGATIVE
Urobilinogen, UA: 0.2

## 2010-07-24 LAB — CBC WITH DIFFERENTIAL/PLATELET
Basophils Absolute: 0 10*3/uL (ref 0.0–0.1)
Eosinophils Absolute: 0.2 10*3/uL (ref 0.0–0.7)
Hemoglobin: 13.7 g/dL (ref 13.0–17.0)
Lymphocytes Relative: 38.6 % (ref 12.0–46.0)
MCHC: 34.9 g/dL (ref 30.0–36.0)
Monocytes Relative: 11.2 % (ref 3.0–12.0)
Neutrophils Relative %: 46.2 % (ref 43.0–77.0)
RBC: 4.28 Mil/uL (ref 4.22–5.81)
RDW: 14.6 % (ref 11.5–14.6)

## 2010-07-24 LAB — BASIC METABOLIC PANEL
BUN: 23 mg/dL (ref 6–23)
Calcium: 9.5 mg/dL (ref 8.4–10.5)
Creatinine, Ser: 1.1 mg/dL (ref 0.4–1.5)
GFR: 68.99 mL/min (ref >60.00)
Glucose, Bld: 87 mg/dL (ref 70–99)
Potassium: 4 mEq/L (ref 3.5–5.1)

## 2010-07-24 LAB — TSH: TSH: 1.18 u[IU]/mL (ref 0.35–5.50)

## 2010-07-24 LAB — PSA: PSA: 0.36 ng/mL (ref 0.10–4.00)

## 2010-07-24 MED ORDER — VALSARTAN-HYDROCHLOROTHIAZIDE 320-25 MG PO TABS: 1.0000 | ORAL_TABLET | Freq: Every day | ORAL | Status: DC

## 2010-07-24 MED ORDER — ETODOLAC 500 MG PO TABS: 500.0000 mg | ORAL_TABLET | Freq: Two times a day (BID) | ORAL | Status: DC

## 2010-07-24 MED ORDER — FENOFIBRATE 145 MG PO TABS: 145.0000 mg | ORAL_TABLET | Freq: Every day | ORAL | Status: DC

## 2010-07-24 MED ORDER — AMLODIPINE BESYLATE 5 MG PO TABS: 5.0000 mg | ORAL_TABLET | Freq: Every day | ORAL | Status: DC

## 2010-07-24 MED ORDER — ATORVASTATIN CALCIUM 20 MG PO TABS: 20.0000 mg | ORAL_TABLET | Freq: Every day | ORAL | Status: DC

## 2010-07-24 NOTE — Progress Notes (Signed)
  Subjective:    Patient ID: [name], male    DOB: 11/16/1945, 65 y.o.   MRN: [id number]  HPI 65 yr old male for a cpx. He feels well. He exercises and watches his diet. His BP at home runs 130s over 80s.   Review of Systems  Constitutional: Negative.   HENT: Negative.   Eyes: Negative.   Respiratory: Negative.   Cardiovascular: Negative.   Gastrointestinal: Negative.   Genitourinary: Negative.   Musculoskeletal: Negative.   Skin: Negative.   Neurological: Negative.   Hematological: Negative.   Psychiatric/Behavioral: Negative.        Objective:   Physical Exam  Constitutional: He is oriented to person, place, and time. He appears well-developed and well-nourished. No distress.  HENT:  Head: Normocephalic and atraumatic.  Right Ear: External ear normal.  Left Ear: External ear normal.  Nose: Nose normal.  Mouth/Throat: Oropharynx is clear and moist. No oropharyngeal exudate.  Eyes: Conjunctivae and EOM are normal. Pupils are equal, round, and reactive to light. Right eye exhibits no discharge. Left eye exhibits no discharge. No scleral icterus.  Neck: Neck supple. No JVD present. No tracheal deviation present. No thyromegaly present.  Cardiovascular: Normal rate, regular rhythm, normal heart sounds and intact distal pulses.  Exam reveals no gallop and no friction rub.   No murmur heard.      EKG normal  Pulmonary/Chest: Effort normal and breath sounds normal. No respiratory distress. He has no wheezes. He has no rales. He exhibits no tenderness.  Abdominal: Soft. Bowel sounds are normal. He exhibits no distension and no mass. There is no tenderness. There is no rebound and no guarding.  Genitourinary: Rectum normal, prostate normal and penis normal. Guaiac negative stool. No penile tenderness.  Musculoskeletal: Normal range of motion. He exhibits no edema and no tenderness.  Lymphadenopathy:    He has no cervical adenopathy.  Neurological: He is alert and oriented to  person, place, and time. He has normal reflexes. No cranial nerve deficit. He exhibits normal muscle tone. Coordination normal.  Skin: Skin is warm and dry. No rash noted. He is not diaphoretic. No erythema. No pallor.  Psychiatric: He has a normal mood and affect. His behavior is normal. Judgment and thought content normal.          Assessment & Plan:  Get fasting labs

## 2010-07-25 NOTE — Progress Notes (Signed)
Notified pt and he will pick up a copy of labs.

## 2010-10-12 NOTE — Assessment & Plan Note (Signed)
[facility] OFFICE NOTE   [name], [name]                       MRN:          [id number]  DATE:07/15/2006                            DOB:          05-Sep-1945    This is a 65 year old gentleman for a complete physical examination.  In  general, he feels fine and has no complaints.  He does, however, note  that his blood pressures have elevated somewhat over the past year.  His  diastolic values remain below 90, but he often has systolic values in  the 140's or the 150's.  A couple weeks ago at his dentist's office they  actually measured a systolic value of 180.  He continues to work out 3  days a week.  He has managed to lose about 10 pounds over the last year.  Otherwise, he is doing well.  Of note, his last colonoscopy was in  September, 2005 and was unremarkable.  For other details of his past  medical history, family history, social history, habits, etc., refer to  our last physical note dated July 15, 2005.   ALLERGIES:  NONE.   CURRENT MEDICATIONS:  1. Aspirin 81 mg per day.  2. Multivitamins daily.  3. Diovan HCT 320/25 once a day.  4. Lipitor 10 mg once a day.  5. TriCor 145 mg once a day.  6. Fish Oil supplements daily.   OBJECTIVE:  Height 5 feet 8 inches, weight 198, BP 152/82, pulse 68 and  regular.  GENERAL:  He remains overweight.  SKIN:  Clear.  EYES:  Clear.  EARS:  Clear.  OROPHARYNX:  Clear.  NECK:  Supple without lymphadenopathy, masses.  LUNGS:  Clear.  CARDIAC:  Rate and rhythm are regular without gallops, murmurs or rubs.  Distal pulses are full.  The EKG is within normal limits.  ABDOMEN:  Soft, normal bowel sounds, nontender, no masses.  GENITALIA:  Normal male, he is circumcised.  RECTAL:  No masses or tenderness.  Prostate is within normal limits.  Stool hemoccult negative.  EXTREMITIES:  No clubbing, cyanosis or edema.  NEUROLOGIC:  Grossly intact.   He was here  for fasting labs on February 12th.  These were remarkable  only for his lipid panel which actually is quite reasonable.  Triglycerides are excellent at 102, HDL is slightly low at 38 and LDL is  slightly high at 102.   ASSESSMENT AND PLAN:  1. Complete physical.  I encouraged him to continue with his exercise      and to try to lose a little more weight.  2. Hyperlipidemia, well controlled.  3. Hypertension.  Will add Norvasc 5 mg once a day to his current      regimen.  He will continue to check his pressures at home on a      daily basis.   I asked to see him back in the office in 3 months.     Tera Mater. Clent Ridges, MD  Electronically Signed    SAF/MedQ  DD: 07/15/2006  DT: 07/15/2006  Job #: [id number]

## 2011-02-18 ENCOUNTER — Other Ambulatory Visit: Payer: Self-pay | Admitting: Family Medicine

## 2011-02-18 NOTE — Telephone Encounter (Signed)
Script sent e-scribe 

## 2011-07-05 ENCOUNTER — Telehealth: Payer: Self-pay | Admitting: Family Medicine

## 2011-07-05 NOTE — Telephone Encounter (Signed)
I spoke with pt and he will check with his insurance company to see if this medication is covered. He will call us back, if he decides to change. He is coming in for a CPE in March 2013.

## 2011-07-05 NOTE — Telephone Encounter (Signed)
Patient is on Medicare and looking into [employer]. Most of the plans will not cover Diovan HCT, which he is currently on. He is wondering if there are any equivalent meds he could try instead. If so, please let me know some options, I will let Kedar know, and he can check with the insurance plans he's considering.  Also, is it ok for him to bring his home BP machine in here to be calibrated? Do you know if we do that?  Thanks!

## 2011-07-05 NOTE — Telephone Encounter (Signed)
Switch him from Diovan HCT to Losartan HCT 100/25 once a day. Call in one year supply. Yes, he is welcome to bring in his BP monitor from home

## 2011-07-12 ENCOUNTER — Other Ambulatory Visit: Payer: Self-pay | Admitting: Family Medicine

## 2011-07-15 NOTE — Telephone Encounter (Signed)
Pt is requesting a 90 day supply, can we do this?

## 2011-07-16 NOTE — Telephone Encounter (Signed)
Call in #[phone] with one rf. He needs an OV soon

## 2011-08-19 ENCOUNTER — Ambulatory Visit: Payer: Medicare Other | Admitting: Family Medicine

## 2011-09-11 ENCOUNTER — Encounter: Payer: Self-pay | Admitting: Family Medicine

## 2011-09-11 ENCOUNTER — Ambulatory Visit (INDEPENDENT_AMBULATORY_CARE_PROVIDER_SITE_OTHER): Payer: Medicare Other | Admitting: Family Medicine

## 2011-09-11 VITALS — BP 130/86 | HR 74 | Temp 98.6°F | Ht 68.0 in | Wt 195.0 lb

## 2011-09-11 DIAGNOSIS — N401 Enlarged prostate with lower urinary tract symptoms: Secondary | ICD-10-CM

## 2011-09-11 DIAGNOSIS — E785 Hyperlipidemia, unspecified: Secondary | ICD-10-CM

## 2011-09-11 DIAGNOSIS — N138 Other obstructive and reflux uropathy: Secondary | ICD-10-CM

## 2011-09-11 DIAGNOSIS — I10 Essential (primary) hypertension: Secondary | ICD-10-CM

## 2011-09-11 DIAGNOSIS — Z0000 Encounter for general adult medical examination without abnormal findings: Secondary | ICD-10-CM

## 2011-09-11 DIAGNOSIS — N139 Obstructive and reflux uropathy, unspecified: Secondary | ICD-10-CM

## 2011-09-11 LAB — CBC WITH DIFFERENTIAL/PLATELET
Basophils Relative: 0.9 % (ref 0.0–3.0)
HCT: 41.6 % (ref 39.0–52.0)
Hemoglobin: 13.9 g/dL (ref 13.0–17.0)
Lymphocytes Relative: 38.5 % (ref 12.0–46.0)
MCHC: 33.4 g/dL (ref 30.0–36.0)
Monocytes Relative: 12.9 % — ABNORMAL HIGH (ref 3.0–12.0)
Neutro Abs: 2.2 10*3/uL (ref 1.4–7.7)
RBC: 4.48 Mil/uL (ref 4.22–5.81)

## 2011-09-11 LAB — LIPID PANEL
Cholesterol: 161 mg/dL (ref 0–200)
HDL: 36.2 mg/dL — ABNORMAL LOW (ref >39.00)
Triglycerides: 104 mg/dL (ref 0.0–149.0)
VLDL: 20.8 mg/dL (ref 0.0–40.0)

## 2011-09-11 LAB — POCT URINALYSIS DIPSTICK
Bilirubin, UA: NEGATIVE
Ketones, UA: NEGATIVE
Leukocytes, UA: NEGATIVE
Spec Grav, UA: 1.02
pH, UA: 7

## 2011-09-11 LAB — BASIC METABOLIC PANEL
BUN: 24 mg/dL — ABNORMAL HIGH (ref 6–23)
CO2: 28 mEq/L (ref 19–32)
Calcium: 9.5 mg/dL (ref 8.4–10.5)
Creatinine, Ser: 1 mg/dL (ref 0.4–1.5)
GFR: 77.38 mL/min (ref >60.00)
Glucose, Bld: 90 mg/dL (ref 70–99)

## 2011-09-11 LAB — HEPATIC FUNCTION PANEL
AST: 39 U/L — ABNORMAL HIGH (ref 0–37)
Bilirubin, Direct: 0.2 mg/dL (ref 0.0–0.3)
Total Bilirubin: 0.7 mg/dL (ref 0.3–1.2)

## 2011-09-11 LAB — TSH: TSH: 0.99 u[IU]/mL (ref 0.35–5.50)

## 2011-09-11 MED ORDER — VALSARTAN-HYDROCHLOROTHIAZIDE 320-25 MG PO TABS: 1.0000 | ORAL_TABLET | Freq: Every day | ORAL | Status: DC

## 2011-09-11 MED ORDER — AMLODIPINE BESYLATE 5 MG PO TABS: 5.0000 mg | ORAL_TABLET | Freq: Every day | ORAL | Status: DC

## 2011-09-11 MED ORDER — ATORVASTATIN CALCIUM 20 MG PO TABS: 20.0000 mg | ORAL_TABLET | Freq: Every day | ORAL | Status: DC

## 2011-09-11 NOTE — Progress Notes (Signed)
  Subjective:    Patient ID: [name], male    DOB: [birth year], 66 y.o.   MRN: [id number]  HPI 66 yr old male for a cpx. He feels well and has no concerns. He does ask if we can eliminate any of his meds.    Review of Systems  Constitutional: Negative.   HENT: Negative.   Eyes: Negative.   Respiratory: Negative.   Cardiovascular: Negative.   Gastrointestinal: Negative.   Genitourinary: Negative.   Musculoskeletal: Negative.   Skin: Negative.   Neurological: Negative.   Hematological: Negative.   Psychiatric/Behavioral: Negative.        Objective:   Physical Exam  Constitutional: He is oriented to person, place, and time. He appears well-developed and well-nourished. No distress.  HENT:  Head: Normocephalic and atraumatic.  Right Ear: External ear normal.  Left Ear: External ear normal.  Nose: Nose normal.  Mouth/Throat: Oropharynx is clear and moist. No oropharyngeal exudate.  Eyes: Conjunctivae and EOM are normal. Pupils are equal, round, and reactive to light. Right eye exhibits no discharge. Left eye exhibits no discharge. No scleral icterus.  Neck: Neck supple. No JVD present. No tracheal deviation present. No thyromegaly present.  Cardiovascular: Normal rate, regular rhythm, normal heart sounds and intact distal pulses.  Exam reveals no gallop and no friction rub.   No murmur heard.      EKG normal   Pulmonary/Chest: Effort normal and breath sounds normal. No respiratory distress. He has no wheezes. He has no rales. He exhibits no tenderness.  Abdominal: Soft. Bowel sounds are normal. He exhibits no distension and no mass. There is no tenderness. There is no rebound and no guarding.  Genitourinary: Rectum normal, prostate normal and penis normal. Guaiac negative stool. No penile tenderness.  Musculoskeletal: Normal range of motion. He exhibits no edema and no tenderness.  Lymphadenopathy:    He has no cervical adenopathy.  Neurological: He is alert and oriented to  person, place, and time. He has normal reflexes. No cranial nerve deficit. He exhibits normal muscle tone. Coordination normal.  Skin: Skin is warm and dry. No rash noted. He is not diaphoretic. No erythema. No pallor.  Psychiatric: He has a normal mood and affect. His behavior is normal. Judgment and thought content normal.          Assessment & Plan:  Get fasting labs. Stop taking Tricor.

## 2011-09-17 NOTE — Progress Notes (Signed)
Quick Note:  Pt informed, he requested a copy be mailed ______

## 2012-06-12 ENCOUNTER — Telehealth: Payer: Self-pay | Admitting: Family Medicine

## 2012-06-12 NOTE — Telephone Encounter (Signed)
I am not comfortable with doing this. I suggest he use up the meds he has and then we can call in 30 day rx for all these to his local pharmacy until the cpx

## 2012-06-12 NOTE — Telephone Encounter (Signed)
Pt called & scheduled his cpx for 09/14/12. He will run out of meds prior to that. He no longer uses [facility] - now uses [facility] mail order [facility]), but only for TWO rx. Pt ID: [id number]  Please send the following via [facility] mail order for 90-day rx: amLODipine (NORVASC) 5 MG tablet atorvastatin (LIPITOR) 20 MG tablet  Needs the following sent to Karin Golden at [id number]: valsartan-hydrochlorothiazide (DIOVAN-HCT) 320-25 MG per tablet ONLY HAS 3 WEEKS OF THIS ONE LEFT  Please call pt if any issues w/this. He has about 1-2 months left of the mail order rx, but not enough to get to April.

## 2012-06-15 MED ORDER — AMLODIPINE BESYLATE 5 MG PO TABS: 5.0000 mg | ORAL_TABLET | Freq: Every day | ORAL | Status: DC

## 2012-06-15 MED ORDER — ATORVASTATIN CALCIUM 20 MG PO TABS: 20.0000 mg | ORAL_TABLET | Freq: Every day | ORAL | Status: DC

## 2012-06-15 MED ORDER — VALSARTAN-HYDROCHLOROTHIAZIDE 320-25 MG PO TABS: 1.0000 | ORAL_TABLET | Freq: Every day | ORAL | Status: DC

## 2012-06-15 NOTE — Telephone Encounter (Signed)
I spoke with pt and sent scripts in. Per Dr. Clent Ridges, okay to give until pt comes in for CPE, it has not been a year yet since last visit.

## 2012-09-14 ENCOUNTER — Encounter: Payer: Self-pay | Admitting: Family Medicine

## 2012-09-14 ENCOUNTER — Ambulatory Visit (INDEPENDENT_AMBULATORY_CARE_PROVIDER_SITE_OTHER): Payer: Medicare Other | Admitting: Family Medicine

## 2012-09-14 VITALS — BP 142/80 | HR 98 | Temp 98.1°F | Ht 68.0 in | Wt 191.0 lb

## 2012-09-14 DIAGNOSIS — N138 Other obstructive and reflux uropathy: Secondary | ICD-10-CM

## 2012-09-14 DIAGNOSIS — N401 Enlarged prostate with lower urinary tract symptoms: Secondary | ICD-10-CM

## 2012-09-14 DIAGNOSIS — E785 Hyperlipidemia, unspecified: Secondary | ICD-10-CM

## 2012-09-14 DIAGNOSIS — N139 Obstructive and reflux uropathy, unspecified: Secondary | ICD-10-CM

## 2012-09-14 DIAGNOSIS — I10 Essential (primary) hypertension: Secondary | ICD-10-CM

## 2012-09-14 LAB — CBC WITH DIFFERENTIAL/PLATELET
Basophils Absolute: 0 10*3/uL (ref 0.0–0.1)
Eosinophils Relative: 3.2 % (ref 0.0–5.0)
Monocytes Absolute: 0.8 10*3/uL (ref 0.1–1.0)
Monocytes Relative: 13.5 % — ABNORMAL HIGH (ref 3.0–12.0)
Neutrophils Relative %: 45 % (ref 43.0–77.0)
Platelets: 203 10*3/uL (ref 150.0–400.0)
RDW: 13.6 % (ref 11.5–14.6)
WBC: 6.1 10*3/uL (ref 4.5–10.5)

## 2012-09-14 LAB — HEPATIC FUNCTION PANEL
AST: 34 U/L (ref 0–37)
Albumin: 4.1 g/dL (ref 3.5–5.2)
Alkaline Phosphatase: 33 U/L — ABNORMAL LOW (ref 39–117)
Bilirubin, Direct: 0.2 mg/dL (ref 0.0–0.3)
Total Bilirubin: 1.3 mg/dL — ABNORMAL HIGH (ref 0.3–1.2)
Total Protein: 6.7 g/dL (ref 6.0–8.3)

## 2012-09-14 LAB — POCT URINALYSIS DIPSTICK
Glucose, UA: NEGATIVE
Nitrite, UA: NEGATIVE
Protein, UA: NEGATIVE
Urobilinogen, UA: 0.2

## 2012-09-14 LAB — BASIC METABOLIC PANEL
CO2: 30 mEq/L (ref 19–32)
Chloride: 102 mEq/L (ref 96–112)
Potassium: 4.4 mEq/L (ref 3.5–5.1)

## 2012-09-14 LAB — TSH: TSH: 1.2 u[IU]/mL (ref 0.35–5.50)

## 2012-09-14 LAB — LIPID PANEL
LDL Cholesterol: 87 mg/dL (ref 0–99)
Total CHOL/HDL Ratio: 5

## 2012-09-14 LAB — PSA: PSA: 0.57 ng/mL (ref 0.10–4.00)

## 2012-09-14 MED ORDER — AMLODIPINE BESYLATE 5 MG PO TABS: 5.0000 mg | ORAL_TABLET | Freq: Every day | ORAL | Status: DC

## 2012-09-14 MED ORDER — ATORVASTATIN CALCIUM 20 MG PO TABS: 20.0000 mg | ORAL_TABLET | Freq: Every day | ORAL | Status: DC

## 2012-09-14 MED ORDER — VALSARTAN-HYDROCHLOROTHIAZIDE 320-25 MG PO TABS: 1.0000 | ORAL_TABLET | Freq: Every day | ORAL | Status: DC

## 2012-09-14 NOTE — Progress Notes (Signed)
  Subjective:    Patient ID: [name], male    DOB: [birth year], 67 y.o.   MRN: [id number]  HPI 67 yr old male for a cpx. He feels well and has no concerns. He exercises daily.    Review of Systems  Constitutional: Negative.   HENT: Negative.   Eyes: Negative.   Respiratory: Negative.   Cardiovascular: Negative.   Gastrointestinal: Negative.   Genitourinary: Negative.   Musculoskeletal: Negative.   Skin: Negative.   Neurological: Negative.   Psychiatric/Behavioral: Negative.        Objective:   Physical Exam  Constitutional: He is oriented to person, place, and time. He appears well-developed and well-nourished. No distress.  HENT:  Head: Normocephalic and atraumatic.  Right Ear: External ear normal.  Left Ear: External ear normal.  Nose: Nose normal.  Mouth/Throat: Oropharynx is clear and moist. No oropharyngeal exudate.  Eyes: Conjunctivae and EOM are normal. Pupils are equal, round, and reactive to light. Right eye exhibits no discharge. Left eye exhibits no discharge. No scleral icterus.  Neck: Neck supple. No JVD present. No tracheal deviation present. No thyromegaly present.  Cardiovascular: Normal rate, regular rhythm, normal heart sounds and intact distal pulses.  Exam reveals no gallop and no friction rub.   No murmur heard. EKG normal   Pulmonary/Chest: Effort normal and breath sounds normal. No respiratory distress. He has no wheezes. He has no rales. He exhibits no tenderness.  Abdominal: Soft. Bowel sounds are normal. He exhibits no distension and no mass. There is no tenderness. There is no rebound and no guarding.  Genitourinary: Rectum normal, prostate normal and penis normal. Guaiac negative stool. No penile tenderness.  Musculoskeletal: Normal range of motion. He exhibits no edema and no tenderness.  Lymphadenopathy:    He has no cervical adenopathy.  Neurological: He is alert and oriented to person, place, and time. He has normal reflexes. No cranial  nerve deficit. He exhibits normal muscle tone. Coordination normal.  Skin: Skin is warm and dry. No rash noted. He is not diaphoretic. No erythema. No pallor.  Psychiatric: He has a normal mood and affect. His behavior is normal. Judgment and thought content normal.          Assessment & Plan:  Well exam. Get fasting labs

## 2012-09-15 NOTE — Progress Notes (Signed)
Quick Note:  I left voice messasge with results. ______

## 2012-12-30 ENCOUNTER — Other Ambulatory Visit: Payer: Self-pay

## 2013-04-01 ENCOUNTER — Other Ambulatory Visit: Payer: Self-pay

## 2013-09-16 ENCOUNTER — Encounter: Payer: Self-pay | Admitting: Family Medicine

## 2013-09-16 ENCOUNTER — Ambulatory Visit (INDEPENDENT_AMBULATORY_CARE_PROVIDER_SITE_OTHER): Payer: Medicare Other | Admitting: Family Medicine

## 2013-09-16 VITALS — BP 136/72 | HR 73 | Temp 98.2°F | Ht 67.75 in | Wt 188.0 lb

## 2013-09-16 DIAGNOSIS — N401 Enlarged prostate with lower urinary tract symptoms: Secondary | ICD-10-CM

## 2013-09-16 DIAGNOSIS — I10 Essential (primary) hypertension: Secondary | ICD-10-CM

## 2013-09-16 DIAGNOSIS — E785 Hyperlipidemia, unspecified: Secondary | ICD-10-CM

## 2013-09-16 DIAGNOSIS — N138 Other obstructive and reflux uropathy: Secondary | ICD-10-CM

## 2013-09-16 DIAGNOSIS — N139 Obstructive and reflux uropathy, unspecified: Secondary | ICD-10-CM

## 2013-09-16 LAB — POCT URINALYSIS DIPSTICK
Bilirubin, UA: NEGATIVE
GLUCOSE UA: NEGATIVE
Ketones, UA: NEGATIVE
Leukocytes, UA: NEGATIVE
Nitrite, UA: NEGATIVE
PH UA: 6.5
Protein, UA: NEGATIVE
RBC UA: NEGATIVE
SPEC GRAV UA: 1.02
UROBILINOGEN UA: 0.2

## 2013-09-16 LAB — PSA: PSA: 0.58 ng/mL (ref 0.10–4.00)

## 2013-09-16 LAB — HEPATIC FUNCTION PANEL
ALK PHOS: 34 U/L — AB (ref 39–117)
ALT: 41 U/L (ref 0–53)
AST: 38 U/L — AB (ref 0–37)
Albumin: 4.2 g/dL (ref 3.5–5.2)
BILIRUBIN DIRECT: 0.2 mg/dL (ref 0.0–0.3)
TOTAL PROTEIN: 6.9 g/dL (ref 6.0–8.3)
Total Bilirubin: 1.5 mg/dL — ABNORMAL HIGH (ref 0.3–1.2)

## 2013-09-16 LAB — BASIC METABOLIC PANEL
BUN: 19 mg/dL (ref 6–23)
CHLORIDE: 104 meq/L (ref 96–112)
CO2: 27 meq/L (ref 19–32)
Calcium: 9.5 mg/dL (ref 8.4–10.5)
Creatinine, Ser: 0.8 mg/dL (ref 0.4–1.5)
GFR: 108.02 mL/min (ref >60.00)
GLUCOSE: 118 mg/dL — AB (ref 70–99)
POTASSIUM: 3.8 meq/L (ref 3.5–5.1)
SODIUM: 139 meq/L (ref 135–145)

## 2013-09-16 LAB — LIPID PANEL
CHOLESTEROL: 138 mg/dL (ref 0–200)
HDL: 33.1 mg/dL — ABNORMAL LOW (ref >39.00)
LDL Cholesterol: 81 mg/dL (ref 0–99)
Total CHOL/HDL Ratio: 4
Triglycerides: 121 mg/dL (ref 0.0–149.0)
VLDL: 24.2 mg/dL (ref 0.0–40.0)

## 2013-09-16 LAB — CBC WITH DIFFERENTIAL/PLATELET
BASOS PCT: 0.7 % (ref 0.0–3.0)
Basophils Absolute: 0 10*3/uL (ref 0.0–0.1)
Eosinophils Absolute: 0.2 10*3/uL (ref 0.0–0.7)
Eosinophils Relative: 2.7 % (ref 0.0–5.0)
HEMATOCRIT: 44.6 % (ref 39.0–52.0)
Hemoglobin: 15.1 g/dL (ref 13.0–17.0)
LYMPHS ABS: 2.2 10*3/uL (ref 0.7–4.0)
Lymphocytes Relative: 36.7 % (ref 12.0–46.0)
MCHC: 34 g/dL (ref 30.0–36.0)
MCV: 93.4 fl (ref 78.0–100.0)
MONO ABS: 0.8 10*3/uL (ref 0.1–1.0)
Monocytes Relative: 14 % — ABNORMAL HIGH (ref 3.0–12.0)
Neutro Abs: 2.7 10*3/uL (ref 1.4–7.7)
Neutrophils Relative %: 45.9 % (ref 43.0–77.0)
PLATELETS: 196 10*3/uL (ref 150.0–400.0)
RBC: 4.77 Mil/uL (ref 4.22–5.81)
RDW: 13.7 % (ref 11.5–14.6)
WBC: 5.9 10*3/uL (ref 4.5–10.5)

## 2013-09-16 LAB — TSH: TSH: 0.73 u[IU]/mL (ref 0.35–5.50)

## 2013-09-16 MED ORDER — ATORVASTATIN CALCIUM 20 MG PO TABS: 20.0000 mg | ORAL_TABLET | Freq: Every day | ORAL | Status: DC

## 2013-09-16 MED ORDER — VALSARTAN-HYDROCHLOROTHIAZIDE 320-25 MG PO TABS: 1.0000 | ORAL_TABLET | Freq: Every day | ORAL | Status: DC

## 2013-09-16 MED ORDER — AMLODIPINE BESYLATE 5 MG PO TABS: 5.0000 mg | ORAL_TABLET | Freq: Every day | ORAL | Status: DC

## 2013-09-16 NOTE — Progress Notes (Signed)
   Subjective:    Patient ID: [name], male    DOB: 08/01/1945, 68 y.o.   MRN: [id number]  HPI 68 yr old male for a cpx. He feels well.    Review of Systems  Constitutional: Negative.   HENT: Negative.   Eyes: Negative.   Respiratory: Negative.   Cardiovascular: Negative.   Gastrointestinal: Negative.   Genitourinary: Negative.   Musculoskeletal: Negative.   Skin: Negative.   Neurological: Negative.   Psychiatric/Behavioral: Negative.        Objective:   Physical Exam  Constitutional: He is oriented to person, place, and time. He appears well-developed and well-nourished. No distress.  HENT:  Head: Normocephalic and atraumatic.  Right Ear: External ear normal.  Left Ear: External ear normal.  Nose: Nose normal.  Mouth/Throat: Oropharynx is clear and moist. No oropharyngeal exudate.  Eyes: Conjunctivae and EOM are normal. Pupils are equal, round, and reactive to light. Right eye exhibits no discharge. Left eye exhibits no discharge. No scleral icterus.  Neck: Neck supple. No JVD present. No tracheal deviation present. No thyromegaly present.  Cardiovascular: Normal rate, regular rhythm, normal heart sounds and intact distal pulses.  Exam reveals no gallop and no friction rub.   No murmur heard. EKG normal  Pulmonary/Chest: Effort normal and breath sounds normal. No respiratory distress. He has no wheezes. He has no rales. He exhibits no tenderness.  Abdominal: Soft. Bowel sounds are normal. He exhibits no distension and no mass. There is no tenderness. There is no rebound and no guarding.  Genitourinary: Rectum normal, prostate normal and penis normal. Guaiac negative stool. No penile tenderness.  Musculoskeletal: Normal range of motion. He exhibits no edema and no tenderness.  Lymphadenopathy:    He has no cervical adenopathy.  Neurological: He is alert and oriented to person, place, and time. He has normal reflexes. No cranial nerve deficit. He exhibits normal muscle  tone. Coordination normal.  Skin: Skin is warm and dry. No rash noted. He is not diaphoretic. No erythema. No pallor.  Psychiatric: He has a normal mood and affect. His behavior is normal. Judgment and thought content normal.          Assessment & Plan:  Well exam. Get fasting labs

## 2013-09-16 NOTE — Progress Notes (Signed)
Pre visit review using our clinic review tool, if applicable. No additional management support is needed unless otherwise documented below in the visit note. 

## 2013-09-17 ENCOUNTER — Telehealth: Payer: Self-pay | Admitting: Family Medicine

## 2013-09-17 NOTE — Telephone Encounter (Signed)
Relevant patient education assigned to patient using Emmi. ° °

## 2014-07-12 ENCOUNTER — Other Ambulatory Visit: Payer: Self-pay | Admitting: Family Medicine

## 2014-09-20 ENCOUNTER — Encounter: Payer: Medicare Other | Admitting: Family Medicine

## 2014-10-04 ENCOUNTER — Ambulatory Visit (INDEPENDENT_AMBULATORY_CARE_PROVIDER_SITE_OTHER): Payer: Medicare Other | Admitting: Family Medicine

## 2014-10-04 ENCOUNTER — Encounter: Payer: Self-pay | Admitting: Family Medicine

## 2014-10-04 VITALS — BP 154/75 | HR 72 | Temp 98.3°F | Ht 67.75 in | Wt 190.0 lb

## 2014-10-04 DIAGNOSIS — N401 Enlarged prostate with lower urinary tract symptoms: Secondary | ICD-10-CM | POA: Diagnosis not present

## 2014-10-04 DIAGNOSIS — M25511 Pain in right shoulder: Secondary | ICD-10-CM | POA: Diagnosis not present

## 2014-10-04 DIAGNOSIS — E785 Hyperlipidemia, unspecified: Secondary | ICD-10-CM | POA: Diagnosis not present

## 2014-10-04 DIAGNOSIS — I10 Essential (primary) hypertension: Secondary | ICD-10-CM

## 2014-10-04 DIAGNOSIS — N138 Other obstructive and reflux uropathy: Secondary | ICD-10-CM

## 2014-10-04 LAB — BASIC METABOLIC PANEL
BUN: 18 mg/dL (ref 6–23)
CALCIUM: 9.5 mg/dL (ref 8.4–10.5)
CO2: 29 meq/L (ref 19–32)
Chloride: 102 mEq/L (ref 96–112)
Creatinine, Ser: 0.74 mg/dL (ref 0.40–1.50)
GFR: 111.05 mL/min (ref >60.00)
Glucose, Bld: 120 mg/dL — ABNORMAL HIGH (ref 70–99)
POTASSIUM: 3.9 meq/L (ref 3.5–5.1)
Sodium: 137 mEq/L (ref 135–145)

## 2014-10-04 LAB — CBC WITH DIFFERENTIAL/PLATELET
BASOS ABS: 0 10*3/uL (ref 0.0–0.1)
Basophils Relative: 0.6 % (ref 0.0–3.0)
EOS ABS: 0.1 10*3/uL (ref 0.0–0.7)
Eosinophils Relative: 1.8 % (ref 0.0–5.0)
HCT: 43.7 % (ref 39.0–52.0)
HEMOGLOBIN: 15.1 g/dL (ref 13.0–17.0)
LYMPHS ABS: 2 10*3/uL (ref 0.7–4.0)
Lymphocytes Relative: 34.8 % (ref 12.0–46.0)
MCHC: 34.5 g/dL (ref 30.0–36.0)
MCV: 90.3 fl (ref 78.0–100.0)
MONOS PCT: 11.5 % (ref 3.0–12.0)
Monocytes Absolute: 0.7 10*3/uL (ref 0.1–1.0)
NEUTROS ABS: 3 10*3/uL (ref 1.4–7.7)
Neutrophils Relative %: 51.3 % (ref 43.0–77.0)
Platelets: 193 10*3/uL (ref 150.0–400.0)
RBC: 4.84 Mil/uL (ref 4.22–5.81)
RDW: 14.1 % (ref 11.5–15.5)
WBC: 5.8 10*3/uL (ref 4.0–10.5)

## 2014-10-04 LAB — HEPATIC FUNCTION PANEL
ALBUMIN: 4.2 g/dL (ref 3.5–5.2)
ALT: 33 U/L (ref 0–53)
AST: 29 U/L (ref 0–37)
Alkaline Phosphatase: 34 U/L — ABNORMAL LOW (ref 39–117)
Bilirubin, Direct: 0.2 mg/dL (ref 0.0–0.3)
TOTAL PROTEIN: 6.9 g/dL (ref 6.0–8.3)
Total Bilirubin: 1.4 mg/dL — ABNORMAL HIGH (ref 0.2–1.2)

## 2014-10-04 LAB — POCT URINALYSIS DIPSTICK
Bilirubin, UA: NEGATIVE
Glucose, UA: NEGATIVE
Ketones, UA: NEGATIVE
LEUKOCYTES UA: NEGATIVE
Nitrite, UA: NEGATIVE
PH UA: 6.5
PROTEIN UA: NEGATIVE
RBC UA: NEGATIVE
SPEC GRAV UA: 1.01
Urobilinogen, UA: 0.2

## 2014-10-04 LAB — LIPID PANEL
Cholesterol: 159 mg/dL (ref 0–200)
HDL: 35.4 mg/dL — AB (ref >39.00)
LDL Cholesterol: 89 mg/dL (ref 0–99)
NonHDL: 123.6
Total CHOL/HDL Ratio: 4
Triglycerides: 172 mg/dL — ABNORMAL HIGH (ref 0.0–149.0)
VLDL: 34.4 mg/dL (ref 0.0–40.0)

## 2014-10-04 LAB — PSA: PSA: 0.47 ng/mL (ref 0.10–4.00)

## 2014-10-04 LAB — TSH: TSH: 0.81 u[IU]/mL (ref 0.35–4.50)

## 2014-10-04 NOTE — Progress Notes (Signed)
Pre visit review using our clinic review tool, if applicable. No additional management support is needed unless otherwise documented below in the visit note. 

## 2014-10-04 NOTE — Progress Notes (Signed)
   Subjective:    Patient ID: Nathaniel LoosenLarry C Stewart, male    DOB: 08/21/1945, 69 y.o.   MRN: [id number]  HPI 69 yr old male to follow up on lipids, BP, and to ask about right shoulder pain. This pain started about 6 months ago. No hx of trauma. He does work out with [employer] teice a week. He notes that when the shoulder has some pain, lifting weights actually makes it feels better. He does nothing for the pain. His BP is stable at home, usually in the 130s over 80s.    Review of Systems  Constitutional: Negative.   HENT: Negative.   Eyes: Negative.   Respiratory: Negative.   Cardiovascular: Negative.   Gastrointestinal: Negative.   Genitourinary: Negative.   Musculoskeletal: Positive for arthralgias. Negative for myalgias, back pain, joint swelling, gait problem, neck pain and neck stiffness.  Skin: Negative.   Neurological: Negative.   Psychiatric/Behavioral: Negative.        Objective:   Physical Exam  Constitutional: He is oriented to person, place, and time. He appears well-developed and well-nourished. No distress.  HENT:  Head: Normocephalic and atraumatic.  Right Ear: External ear normal.  Left Ear: External ear normal.  Nose: Nose normal.  Mouth/Throat: Oropharynx is clear and moist. No oropharyngeal exudate.  Eyes: Conjunctivae and EOM are normal. Pupils are equal, round, and reactive to light. Right eye exhibits no discharge. Left eye exhibits no discharge. No scleral icterus.  Neck: Neck supple. No JVD present. No tracheal deviation present. No thyromegaly present.  Cardiovascular: Normal rate, regular rhythm, normal heart sounds and intact distal pulses.  Exam reveals no gallop and no friction rub.   No murmur heard. EKG normal   Pulmonary/Chest: Effort normal and breath sounds normal. No respiratory distress. He has no wheezes. He has no rales. He exhibits no tenderness.  Abdominal: Soft. Bowel sounds are normal. He exhibits no distension and no mass. There is no tenderness.  There is no rebound and no guarding.  Genitourinary: Rectum normal, prostate normal and penis normal. Guaiac negative stool. No penile tenderness.  Musculoskeletal: Normal range of motion. He exhibits no edema.  Tender over the anterior right shoulder, especialy in the subacromial space. Full ROM. No crepitus   Lymphadenopathy:    He has no cervical adenopathy.  Neurological: He is alert and oriented to person, place, and time. He has normal reflexes. No cranial nerve deficit. He exhibits normal muscle tone. Coordination normal.  Skin: Skin is warm and dry. No rash noted. He is not diaphoretic. No erythema. No pallor.  Psychiatric: He has a normal mood and affect. His behavior is normal. Judgment and thought content normal.          Assessment & Plan:  His HTN is stable. We will get fasting labs today to check his lipids, among other things. He seems to have some bursitis in the shoulder. He can use ice and Ibuprofen prn

## 2014-10-14 ENCOUNTER — Other Ambulatory Visit: Payer: Self-pay | Admitting: Family Medicine

## 2014-11-21 ENCOUNTER — Other Ambulatory Visit: Payer: Self-pay

## 2014-11-25 ENCOUNTER — Encounter: Payer: Self-pay | Admitting: Family Medicine

## 2014-11-25 ENCOUNTER — Ambulatory Visit (INDEPENDENT_AMBULATORY_CARE_PROVIDER_SITE_OTHER): Payer: Medicare Other | Admitting: Family Medicine

## 2014-11-25 VITALS — BP 157/73 | HR 69 | Temp 98.5°F | Ht 67.75 in | Wt 195.0 lb

## 2014-11-25 DIAGNOSIS — Z23 Encounter for immunization: Secondary | ICD-10-CM

## 2014-11-25 DIAGNOSIS — M25561 Pain in right knee: Secondary | ICD-10-CM | POA: Diagnosis not present

## 2014-11-25 MED ORDER — ETODOLAC 500 MG PO TABS: 500.0000 mg | ORAL_TABLET | Freq: Two times a day (BID) | ORAL | Status: DC

## 2014-11-25 NOTE — Addendum Note (Signed)
Addended by: Gershon CraneFRY, Nil Xiong A on: 11/25/2014 04:01 PM   Modules accepted: Orders

## 2014-11-25 NOTE — Progress Notes (Signed)
   Subjective:    Patient ID: Nathaniel LoosenLarry C Stewart, male    DOB: 01/14/1946, 69 y.o.   MRN: [id number]  HPI Here for 3 weeks of pains in the back of the right knee. No swelling or locking or giving way. No trauma but this started after he did a lot of walking and hiking. He has continued to exercise with this and do lower body weight work. Taking Advil.    Review of Systems  Constitutional: Negative.   Musculoskeletal: Positive for arthralgias. Negative for joint swelling.       Objective:   Physical Exam  Constitutional: He appears well-developed and well-nourished.  Musculoskeletal:  He walks normally. The right knee is tender posteriorly and has crepitus. His flexion is limited by pain. Negative anterior drawer sign.           Assessment & Plan:  Knee pain, probably arthritis. Rest, use ice, and take Etodolac prn. Avoid any weight lifting with the legs for one month.

## 2014-11-25 NOTE — Progress Notes (Signed)
Pre visit review using our clinic review tool, if applicable. No additional management support is needed unless otherwise documented below in the visit note. 

## 2014-11-25 NOTE — Addendum Note (Signed)
Addended by: Aniceto BossNIMMONS, Rozell Kettlewell A on: 11/25/2014 02:40 PM   Modules accepted: Orders

## 2015-03-06 ENCOUNTER — Other Ambulatory Visit: Payer: Self-pay | Admitting: Family Medicine

## 2015-04-13 ENCOUNTER — Ambulatory Visit (INDEPENDENT_AMBULATORY_CARE_PROVIDER_SITE_OTHER): Payer: Medicare Other

## 2015-04-13 DIAGNOSIS — Z23 Encounter for immunization: Secondary | ICD-10-CM | POA: Diagnosis not present

## 2015-07-11 ENCOUNTER — Other Ambulatory Visit: Payer: Self-pay | Admitting: Family Medicine

## 2015-12-19 ENCOUNTER — Encounter: Payer: Self-pay | Admitting: Family Medicine

## 2015-12-19 ENCOUNTER — Ambulatory Visit (INDEPENDENT_AMBULATORY_CARE_PROVIDER_SITE_OTHER): Payer: Medicare Other | Admitting: Family Medicine

## 2015-12-19 VITALS — BP 145/76 | HR 65 | Temp 98.2°F | Ht 67.75 in | Wt 182.0 lb

## 2015-12-19 DIAGNOSIS — I10 Essential (primary) hypertension: Secondary | ICD-10-CM | POA: Diagnosis not present

## 2015-12-19 DIAGNOSIS — N401 Enlarged prostate with lower urinary tract symptoms: Secondary | ICD-10-CM

## 2015-12-19 DIAGNOSIS — N138 Other obstructive and reflux uropathy: Secondary | ICD-10-CM | POA: Insufficient documentation

## 2015-12-19 DIAGNOSIS — R739 Hyperglycemia, unspecified: Secondary | ICD-10-CM

## 2015-12-19 DIAGNOSIS — E785 Hyperlipidemia, unspecified: Secondary | ICD-10-CM | POA: Diagnosis not present

## 2015-12-19 LAB — CBC WITH DIFFERENTIAL/PLATELET
BASOS PCT: 0.8 % (ref 0.0–3.0)
Basophils Absolute: 0 10*3/uL (ref 0.0–0.1)
EOS PCT: 2.5 % (ref 0.0–5.0)
Eosinophils Absolute: 0.1 10*3/uL (ref 0.0–0.7)
HCT: 44.3 % (ref 39.0–52.0)
HEMOGLOBIN: 15.2 g/dL (ref 13.0–17.0)
Lymphocytes Relative: 35.4 % (ref 12.0–46.0)
Lymphs Abs: 2 10*3/uL (ref 0.7–4.0)
MCHC: 34.3 g/dL (ref 30.0–36.0)
MCV: 91.3 fl (ref 78.0–100.0)
MONOS PCT: 12.5 % — AB (ref 3.0–12.0)
Monocytes Absolute: 0.7 10*3/uL (ref 0.1–1.0)
Neutro Abs: 2.8 10*3/uL (ref 1.4–7.7)
Neutrophils Relative %: 48.8 % (ref 43.0–77.0)
Platelets: 194 10*3/uL (ref 150.0–400.0)
RBC: 4.86 Mil/uL (ref 4.22–5.81)
RDW: 13.9 % (ref 11.5–15.5)
WBC: 5.7 10*3/uL (ref 4.0–10.5)

## 2015-12-19 LAB — LIPID PANEL
CHOL/HDL RATIO: 4
Cholesterol: 151 mg/dL (ref 0–200)
HDL: 36.5 mg/dL — ABNORMAL LOW (ref >39.00)
LDL CALC: 90 mg/dL (ref 0–99)
NonHDL: 114.4
TRIGLYCERIDES: 123 mg/dL (ref 0.0–149.0)
VLDL: 24.6 mg/dL (ref 0.0–40.0)

## 2015-12-19 LAB — POC URINALSYSI DIPSTICK (AUTOMATED)
Bilirubin, UA: NEGATIVE
Blood, UA: NEGATIVE
GLUCOSE UA: NEGATIVE
KETONES UA: NEGATIVE
LEUKOCYTES UA: NEGATIVE
Nitrite, UA: NEGATIVE
PROTEIN UA: NEGATIVE
Spec Grav, UA: 1.015
UROBILINOGEN UA: 0.2
pH, UA: 6

## 2015-12-19 LAB — HEPATIC FUNCTION PANEL
ALK PHOS: 38 U/L — AB (ref 39–117)
ALT: 49 U/L (ref 0–53)
AST: 31 U/L (ref 0–37)
Albumin: 4.5 g/dL (ref 3.5–5.2)
BILIRUBIN DIRECT: 0.2 mg/dL (ref 0.0–0.3)
BILIRUBIN TOTAL: 1.2 mg/dL (ref 0.2–1.2)
Total Protein: 6.8 g/dL (ref 6.0–8.3)

## 2015-12-19 LAB — BASIC METABOLIC PANEL
BUN: 21 mg/dL (ref 6–23)
CALCIUM: 9.6 mg/dL (ref 8.4–10.5)
CO2: 27 mEq/L (ref 19–32)
Chloride: 104 mEq/L (ref 96–112)
Creatinine, Ser: 0.81 mg/dL (ref 0.40–1.50)
GFR: 99.71 mL/min (ref >60.00)
Glucose, Bld: 107 mg/dL — ABNORMAL HIGH (ref 70–99)
Potassium: 4 mEq/L (ref 3.5–5.1)
Sodium: 139 mEq/L (ref 135–145)

## 2015-12-19 LAB — PSA: PSA: 0.5 ng/mL (ref 0.10–4.00)

## 2015-12-19 LAB — TSH: TSH: 0.71 u[IU]/mL (ref 0.35–4.50)

## 2015-12-19 LAB — HEMOGLOBIN A1C: Hgb A1c MFr Bld: 6.2 % (ref 4.6–6.5)

## 2015-12-19 NOTE — Progress Notes (Signed)
   Subjective:    Patient ID: [name], male    DOB: 05-May-1946, 70 y.o.   MRN: [id number]  HPI 70 yr old male for a follow up visit. His BP is stable at home in the 130s over 70s or 80s. He feels good and exercises regularly. He sees Dr. Dione Booze every 2 years for eye exams.    Review of Systems  Constitutional: Negative.   HENT: Negative.   Eyes: Negative.   Respiratory: Negative.   Cardiovascular: Negative.   Gastrointestinal: Negative.   Genitourinary: Negative.   Musculoskeletal: Negative.   Skin: Negative.   Neurological: Negative.   Psychiatric/Behavioral: Negative.        Objective:   Physical Exam  Constitutional: He is oriented to person, place, and time. He appears well-developed and well-nourished. No distress.  HENT:  Head: Normocephalic and atraumatic.  Right Ear: External ear normal.  Left Ear: External ear normal.  Nose: Nose normal.  Mouth/Throat: Oropharynx is clear and moist. No oropharyngeal exudate.  Eyes: Conjunctivae and EOM are normal. Pupils are equal, round, and reactive to light. Right eye exhibits no discharge. Left eye exhibits no discharge. No scleral icterus.  Neck: Neck supple. No JVD present. No tracheal deviation present. No thyromegaly present.  Cardiovascular: Normal rate, regular rhythm, normal heart sounds and intact distal pulses.  Exam reveals no gallop and no friction rub.   No murmur heard. EKG normal   Pulmonary/Chest: Effort normal and breath sounds normal. No respiratory distress. He has no wheezes. He has no rales. He exhibits no tenderness.  Abdominal: Soft. Bowel sounds are normal. He exhibits no distension and no mass. There is no tenderness. There is no rebound and no guarding.  Genitourinary: Rectum normal, prostate normal and penis normal. Rectal exam shows guaiac negative stool. No penile tenderness.  Musculoskeletal: Normal range of motion. He exhibits no edema or tenderness.  Lymphadenopathy:    He has no cervical  adenopathy.  Neurological: He is alert and oriented to person, place, and time. He has normal reflexes. No cranial nerve deficit. He exhibits normal muscle tone. Coordination normal.  Skin: Skin is warm and dry. No rash noted. He is not diaphoretic. No erythema. No pallor.  Psychiatric: He has a normal mood and affect. His behavior is normal. Judgment and thought content normal.          Assessment & Plan:  He seems to be doing well. His HTN is stable. We will get fasting labs today to check his lipids. We will get an A1c to check his glucose level. We discussed diet and exercise.  Nelwyn Salisbury, MD

## 2015-12-19 NOTE — Progress Notes (Signed)
Pre visit review using our clinic review tool, if applicable. No additional management support is needed unless otherwise documented below in the visit note. 

## 2016-01-04 ENCOUNTER — Other Ambulatory Visit: Payer: Self-pay | Admitting: Family Medicine

## 2016-04-05 ENCOUNTER — Ambulatory Visit (INDEPENDENT_AMBULATORY_CARE_PROVIDER_SITE_OTHER): Payer: Medicare Other

## 2016-04-05 DIAGNOSIS — Z23 Encounter for immunization: Secondary | ICD-10-CM

## 2016-07-03 ENCOUNTER — Encounter: Payer: Self-pay | Admitting: Family Medicine

## 2016-07-03 ENCOUNTER — Telehealth: Payer: Self-pay | Admitting: Family Medicine

## 2016-07-03 ENCOUNTER — Ambulatory Visit (INDEPENDENT_AMBULATORY_CARE_PROVIDER_SITE_OTHER): Payer: Medicare Other | Admitting: Family Medicine

## 2016-07-03 VITALS — BP 170/75 | HR 94 | Temp 99.8°F

## 2016-07-03 DIAGNOSIS — M5441 Lumbago with sciatica, right side: Secondary | ICD-10-CM

## 2016-07-03 MED ORDER — CYCLOBENZAPRINE HCL 10 MG PO TABS: 10.0000 mg | ORAL_TABLET | Freq: Three times a day (TID) | ORAL | 2 refills | Status: DC | PRN

## 2016-07-03 MED ORDER — METHYLPREDNISOLONE 4 MG PO TBPK: ORAL_TABLET | ORAL | 0 refills | Status: DC

## 2016-07-03 MED ORDER — TRAMADOL HCL 50 MG PO TABS: 100.0000 mg | ORAL_TABLET | Freq: Four times a day (QID) | ORAL | 1 refills | Status: DC | PRN

## 2016-07-03 NOTE — Telephone Encounter (Signed)
PLEASE NOTE: All timestamps contained within this report are represented as [place] Standard Time. CONFIDENTIALTY NOTICE: This fax transmission is intended only for the addressee. It contains information that is legally privileged, confidential or otherwise protected from use or disclosure. If you are not the intended recipient, you are strictly prohibited from reviewing, disclosing, copying using or disseminating any of this information or taking any action in reliance on or regarding this information. If you have received this fax in error, please notify us immediately by telephone so that we can arrange for its return to us. Phone: [phone], Toll-Free: [phone], Fax: [phone] Page: 1 of 1 Call Id: [id number] [facility] - Client TELEPHONE ADVICE RECORD [facility] Patient Name: Nathaniel BaasLARRY Lupu DOB: 01/28/1946 Initial Comment pain down leg, trouble walking Nurse Assessment Nurse: Elijah Birkaldwell, RN, Stark BrayLynda Date/Time (Eastern Time): 07/03/2016 9:08:41 AM Confirm and document reason for call. If symptomatic, describe symptoms. ---Caller states her husband is having pain down right leg, started in buttocks, having trouble walking. Took Meloxicam this am as Advil wasn't working. 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 No injury. Stays active. No fever, no numbness of leg. Doesn't hurt for him to sit, changing position hurts. If he puts pressure on it, that helps. Does the patient have any new or worsening symptoms? ---Yes Will a triage be completed? ---Yes Related visit to physician within the last 2 weeks? ---No Does the PT have any chronic conditions? (i.e. diabetes, asthma, etc.) ---Yes List chronic conditions. ---cholesterol & BP rx Is this a behavioral health or substance abuse call? ---No Guidelines Guideline Title Affirmed Question Affirmed Notes Hip Pain [1] SEVERE pain (e.g., excruciating, unable to do any normal  activities) AND [2] not improved after 2 hours of pain medicine Final Disposition User See Physician within 4 Hours (or PCP triage) Elijah Birkaldwell, RN, Lynda Comments Caller asked if patient could get some type of pain medicine called in so that he can come in for his appt. this afternoon scheduled by nurse. They use [facility] [phone]. NKDA. They also request a wheelchair be available when he comes in for appt. if possible. it takes a while for him to move across a room d/t pain. Caller requests a call to let them know if rx is called in. They can be reached at this #. Referrals REFERRED TO PCP OFFICE Disagree/Comply: Comply

## 2016-07-03 NOTE — Telephone Encounter (Signed)
Pt scheduled with Dr Clent RidgesFry today. Nothing further needed.

## 2016-07-03 NOTE — Progress Notes (Signed)
Pre visit review using our clinic review tool, if applicable. No additional management support is needed unless otherwise documented below in the visit note. Pt unable to weigh 

## 2016-07-03 NOTE — Progress Notes (Signed)
   Subjective:    Patient ID: [name], male    DOB: 09/06/1945, 71 y.o.   MRN: [id number]  HPI Here for the sudden onset yesterday afternoon of severe sharp pain in the right lower back which radiates down the anterior right thigh. He also has numbness and weakness in the right leg. No recent trauma. Advil and Meloxicam have not helped.    Review of Systems  Gastrointestinal: Negative.   Genitourinary: Negative.   Musculoskeletal: Positive for back pain. Negative for arthralgias.  Neurological: Positive for weakness and numbness. Negative for dizziness and light-headedness.       Objective:   Physical Exam  Constitutional: He is oriented to person, place, and time. He appears well-developed and well-nourished.  In a wheelchair. He requires assistance to get up on the exam table, he is in extreme pain  Cardiovascular: Normal rate, regular rhythm, normal heart sounds and intact distal pulses.   Pulmonary/Chest: Effort normal and breath sounds normal.  Musculoskeletal:  Tender over the right lower back and the right sciatic notch. Full ROM. Positive SLR on the right side.   Neurological: He is alert and oriented to person, place, and time.          Assessment & Plan:  Sciatica. Treat with a Medrol dose pack, Flexeril, and Tramadol. Set up an MRI scan soon.  Gershon CraneStephen Oakland Fant, MD

## 2016-07-04 ENCOUNTER — Telehealth: Payer: Self-pay | Admitting: Family Medicine

## 2016-07-04 NOTE — Telephone Encounter (Signed)
Spoke with pt and reviewed medications. Also advised that he can take 1/2 tab Flexeril to reduce excessive sleepiness. Pt voiced understanding. Nothing further needed at this time.

## 2016-07-04 NOTE — Telephone Encounter (Signed)
[facility] - Client TELEPHONE ADVICE RECORD [facility] Patient Name: Nathaniel BaasLARRY Britz DOB: 09/04/1945 Initial Comment Lorita OfficerJoanne Stewart for husband Nathaniel BaasLarry Gaster 03/30/1946 Patient of [name] has excessive sleepiness, slept most of the day. Nurse Assessment Nurse: Ladona RidgelGaddy, RN, Felicia Date/Time (Eastern Time): 07/04/2016 3:33:41 PM Confirm and document reason for call. If symptomatic, describe symptoms. ---PT is sleeping a lot today. He is not difficult to awaken and did get up and had lunch. He walked to lunch and then the couch and now he is sleeping on couch. No fever. He saw [name] yesterday and dx with sciatica and is on dose pack prednisone and flexeril. He is on Ultram also. Not to uncomfortable when not walking. Sciatica is new this week on R side onset TUes. Improved some Does the patient have any new or worsening symptoms? ---Yes Will a triage be completed? ---Yes Related visit to physician within the last 2 weeks? ---Yes Does the PT have any chronic conditions? (i.e. diabetes, asthma, etc.) ---YesList chronic conditions. ---HTN, high cholesterol. Is this a behavioral health or substance abuse call? ---No Guidelines Guideline Title Affirmed Question Affirmed Notes Final Disposition User Clinical Call Gaddy, RN, Sunny SchleinFelicia Comments wife was able to just wake him right up. Pt is on the phone and knows it is THurs the 8th. Denies any confusion. Walking without dizziness. Not slow to respond. He is walking much better than yesterday. Trouble with moving R leg yesterday was related to pain. He is moving R leg pretty good today - but still has some pain but 100% better https://www.drugs.com/sfx/ultram-side-effects.html, https:// www.drugs.com/sfx/tramadol-side-effects.html told her they both cause drowsiness.https://www.drugs.com/sfx/prednisone-sideeffects. html - they all 3 can cause drowsiness - so told her keep the prednisone (bc it decreases  inflamation and then since ultram and flexeril are prn told them to use the lowest dose of ultram or flexeril that he needs. Or use one or the other and spread out as much as they can - do not drive while on these meds and if confused or slow to respond call back - if cant wake up call 911 referenced neurological symptoms triage guide - they deny any onesided weakness or numbness - moving better today no triage for sleepiness when pt is alert and not slow to respond - provided advice on medication side effects see aboveDustings.com/sfx/tramadol-side-effects.html [name]. Amalia HaileyDustin said that MRI location will call them with a schedule. Pt can and did stand just now. She asked when MRI was scheduled so got answer above for her Call Id: [phone]

## 2016-07-11 ENCOUNTER — Telehealth: Payer: Self-pay | Admitting: Family Medicine

## 2016-07-11 ENCOUNTER — Ambulatory Visit
Admission: RE | Admit: 2016-07-11 | Discharge: 2016-07-11 | Disposition: A | Discharge status: Home / Self Care | Payer: Medicare Other | Source: Ambulatory Visit | Attending: Family Medicine | Admitting: Family Medicine

## 2016-07-11 DIAGNOSIS — M5441 Lumbago with sciatica, right side: Secondary | ICD-10-CM

## 2016-07-11 MED ORDER — HYDROCODONE-ACETAMINOPHEN 5-325 MG PO TABS: 1.0000 | ORAL_TABLET | Freq: Four times a day (QID) | ORAL | 0 refills | Status: DC | PRN

## 2016-07-11 NOTE — Telephone Encounter (Signed)
Script is ready for pick up here at front office and I spoke with pt.  

## 2016-07-11 NOTE — Addendum Note (Signed)
Addended by: Gershon CraneFRY, Matai Carpenito A on: 07/11/2016 10:18 PM   Modules accepted: Orders

## 2016-07-11 NOTE — Telephone Encounter (Signed)
° ° ° °  Pt wife called and said he is still in a lot of pain and he has finish the prednisone and he has a MRI scheduled for today and is asking for a stronger pain med. Would [facility] a call back    [facility]

## 2016-07-11 NOTE — Telephone Encounter (Signed)
I wrote for a few Norco to use

## 2016-07-12 ENCOUNTER — Telehealth: Payer: Self-pay | Admitting: Family Medicine

## 2016-07-12 NOTE — Telephone Encounter (Signed)
Please see my Result Note, he already has a referral appt

## 2016-07-12 NOTE — Telephone Encounter (Signed)
Pt  Would like to know what are the results of his MRI  Please call patient at home pt is aware of his referral appointment    Pt scheduled for 07/23/2016 @9 :45am Jackelyn Hoehnumonski Mark L MD [place] Address: [address], [place], [place] [address] Phone: [phone]

## 2016-07-12 NOTE — Telephone Encounter (Signed)
I spoke with pt and went over these results.

## 2016-12-25 ENCOUNTER — Encounter: Payer: Self-pay | Admitting: Family Medicine

## 2016-12-25 ENCOUNTER — Ambulatory Visit (INDEPENDENT_AMBULATORY_CARE_PROVIDER_SITE_OTHER): Payer: Medicare Other | Admitting: Family Medicine

## 2016-12-25 VITALS — BP 150/82 | HR 61 | Temp 98.2°F | Ht 67.75 in | Wt 182.0 lb

## 2016-12-25 DIAGNOSIS — I10 Essential (primary) hypertension: Secondary | ICD-10-CM | POA: Diagnosis not present

## 2016-12-25 DIAGNOSIS — N138 Other obstructive and reflux uropathy: Secondary | ICD-10-CM | POA: Diagnosis not present

## 2016-12-25 DIAGNOSIS — R739 Hyperglycemia, unspecified: Secondary | ICD-10-CM | POA: Diagnosis not present

## 2016-12-25 DIAGNOSIS — E782 Mixed hyperlipidemia: Secondary | ICD-10-CM | POA: Diagnosis not present

## 2016-12-25 DIAGNOSIS — N401 Enlarged prostate with lower urinary tract symptoms: Secondary | ICD-10-CM

## 2016-12-25 LAB — PSA: PSA: 0.44 ng/mL (ref 0.10–4.00)

## 2016-12-25 LAB — POC URINALSYSI DIPSTICK (AUTOMATED)
Bilirubin, UA: NEGATIVE
Clarity, UA: NEGATIVE
Glucose, UA: NEGATIVE
Ketones, UA: NEGATIVE
Leukocytes, UA: NEGATIVE
NITRITE UA: NEGATIVE
PH UA: 6 (ref 5.0–8.0)
PROTEIN UA: NEGATIVE
RBC UA: NEGATIVE
SPEC GRAV UA: 1.025 (ref 1.010–1.025)
Urobilinogen, UA: 0.2 E.U./dL

## 2016-12-25 LAB — LIPID PANEL
CHOLESTEROL: 162 mg/dL (ref 0–200)
HDL: 34.3 mg/dL — ABNORMAL LOW (ref >39.00)
LDL CALC: 99 mg/dL (ref 0–99)
NONHDL: 127.23
TRIGLYCERIDES: 139 mg/dL (ref 0.0–149.0)
Total CHOL/HDL Ratio: 5
VLDL: 27.8 mg/dL (ref 0.0–40.0)

## 2016-12-25 LAB — HEPATIC FUNCTION PANEL
ALT: 25 U/L (ref 0–53)
AST: 23 U/L (ref 0–37)
Albumin: 4.4 g/dL (ref 3.5–5.2)
Alkaline Phosphatase: 43 U/L (ref 39–117)
BILIRUBIN DIRECT: 0.3 mg/dL (ref 0.0–0.3)
BILIRUBIN TOTAL: 1.7 mg/dL — AB (ref 0.2–1.2)
TOTAL PROTEIN: 6.9 g/dL (ref 6.0–8.3)

## 2016-12-25 LAB — BASIC METABOLIC PANEL
BUN: 20 mg/dL (ref 6–23)
CALCIUM: 9.6 mg/dL (ref 8.4–10.5)
CO2: 30 meq/L (ref 19–32)
CREATININE: 0.81 mg/dL (ref 0.40–1.50)
Chloride: 101 mEq/L (ref 96–112)
GFR: 99.42 mL/min (ref >60.00)
Glucose, Bld: 111 mg/dL — ABNORMAL HIGH (ref 70–99)
Potassium: 4 mEq/L (ref 3.5–5.1)
SODIUM: 138 meq/L (ref 135–145)

## 2016-12-25 LAB — TSH: TSH: 1.19 u[IU]/mL (ref 0.35–4.50)

## 2016-12-25 LAB — CBC WITH DIFFERENTIAL/PLATELET
BASOS ABS: 0.1 10*3/uL (ref 0.0–0.1)
BASOS PCT: 1 % (ref 0.0–3.0)
EOS ABS: 0.2 10*3/uL (ref 0.0–0.7)
Eosinophils Relative: 3.1 % (ref 0.0–5.0)
HCT: 44.1 % (ref 39.0–52.0)
HEMOGLOBIN: 15.1 g/dL (ref 13.0–17.0)
LYMPHS PCT: 40.3 % (ref 12.0–46.0)
Lymphs Abs: 2.1 10*3/uL (ref 0.7–4.0)
MCHC: 34.2 g/dL (ref 30.0–36.0)
MCV: 90.1 fl (ref 78.0–100.0)
MONO ABS: 0.7 10*3/uL (ref 0.1–1.0)
Monocytes Relative: 13 % — ABNORMAL HIGH (ref 3.0–12.0)
Neutro Abs: 2.2 10*3/uL (ref 1.4–7.7)
Neutrophils Relative %: 42.6 % — ABNORMAL LOW (ref 43.0–77.0)
Platelets: 189 10*3/uL (ref 150.0–400.0)
RBC: 4.89 Mil/uL (ref 4.22–5.81)
RDW: 14.1 % (ref 11.5–15.5)
WBC: 5.2 10*3/uL (ref 4.0–10.5)

## 2016-12-25 LAB — HEMOGLOBIN A1C: HEMOGLOBIN A1C: 6.7 % — AB (ref 4.6–6.5)

## 2016-12-25 NOTE — Progress Notes (Signed)
   Subjective:    Patient ID: [name], male    DOB: 04/09/1946, 71 y.o.   MRN: [id number]  HPI Here to follow up some issues. He feels great. He still works out several days a week. His BP at home is stable in the 120s over 60s.    Review of Systems  Constitutional: Negative.   HENT: Negative.   Eyes: Negative.   Respiratory: Negative.   Cardiovascular: Negative.   Gastrointestinal: Negative.   Genitourinary: Negative.   Musculoskeletal: Negative.   Skin: Negative.   Neurological: Negative.   Psychiatric/Behavioral: Negative.        Objective:   Physical Exam  Constitutional: He is oriented to person, place, and time. He appears well-developed and well-nourished. No distress.  HENT:  Head: Normocephalic and atraumatic.  Right Ear: External ear normal.  Left Ear: External ear normal.  Nose: Nose normal.  Mouth/Throat: Oropharynx is clear and moist. No oropharyngeal exudate.  Eyes: Pupils are equal, round, and reactive to light. Conjunctivae and EOM are normal. Right eye exhibits no discharge. Left eye exhibits no discharge. No scleral icterus.  Neck: Neck supple. No JVD present. No tracheal deviation present. No thyromegaly present.  Cardiovascular: Normal rate, regular rhythm, normal heart sounds and intact distal pulses.  Exam reveals no gallop and no friction rub.   No murmur heard. Pulmonary/Chest: Effort normal and breath sounds normal. No respiratory distress. He has no wheezes. He has no rales. He exhibits no tenderness.  Abdominal: Soft. Bowel sounds are normal. He exhibits no distension and no mass. There is no tenderness. There is no rebound and no guarding.  Genitourinary: Rectum normal, prostate normal and penis normal. Rectal exam shows guaiac negative stool. No penile tenderness.  Musculoskeletal: Normal range of motion. He exhibits no edema or tenderness.  Lymphadenopathy:    He has no cervical adenopathy.  Neurological: He is alert and oriented to person,  place, and time. He has normal reflexes. No cranial nerve deficit. He exhibits normal muscle tone. Coordination normal.  Skin: Skin is warm and dry. No rash noted. He is not diaphoretic. No erythema. No pallor.  Psychiatric: He has a normal mood and affect. His behavior is normal. Judgment and thought content normal.          Assessment & Plan:  His HTN is stable. We discussed diet and exercise. Get fasting labs today to check his glucose and lipids, among other things. His BPH symptoms are stable.  Gershon CraneStephen Taha Dimond, MD

## 2016-12-25 NOTE — Patient Instructions (Signed)
WE NOW OFFER   [facility] FAST TRACK!!!  SAME DAY Appointments for ACUTE CARE  Such as: Sprains, Injuries, cuts, abrasions, rashes, muscle pain, joint pain, back pain Colds, flu, sore throats, headache, allergies, cough, fever  Ear pain, sinus and eye infections Abdominal pain, nausea, vomiting, diarrhea, upset stomach Animal/insect bites  3 Easy Ways to Schedule: Walk-In Scheduling Call in scheduling Mychart Sign-up: [email or web address]         

## 2016-12-31 ENCOUNTER — Telehealth: Payer: Self-pay | Admitting: Family Medicine

## 2016-12-31 NOTE — Telephone Encounter (Signed)
[facility] from [facility] to change Valsartan due to recall, suggested alternative Losartan/HCTZ.

## 2016-12-31 NOTE — Telephone Encounter (Signed)
Stop Valsartan HCT, switch to Losartan HCT 100/25 to take daily. Send in #[phone] with 3 rf

## 2017-01-02 NOTE — Telephone Encounter (Signed)
I spoke with pt and the one he is taking has not been recalled. He prefers to continue with it and [name] is aware.

## 2017-02-13 ENCOUNTER — Encounter: Payer: Self-pay | Admitting: Family Medicine

## 2017-03-12 ENCOUNTER — Other Ambulatory Visit: Payer: Self-pay | Admitting: Family Medicine

## 2017-03-21 ENCOUNTER — Ambulatory Visit (INDEPENDENT_AMBULATORY_CARE_PROVIDER_SITE_OTHER): Payer: Medicare Other

## 2017-03-21 DIAGNOSIS — Z23 Encounter for immunization: Secondary | ICD-10-CM | POA: Diagnosis not present

## 2017-09-02 ENCOUNTER — Ambulatory Visit (INDEPENDENT_AMBULATORY_CARE_PROVIDER_SITE_OTHER): Payer: Medicare Other | Admitting: Family Medicine

## 2017-09-02 ENCOUNTER — Encounter: Payer: Self-pay | Admitting: Family Medicine

## 2017-09-02 VITALS — BP 124/80 | HR 65 | Temp 98.0°F | Ht 67.75 in | Wt 192.6 lb

## 2017-09-02 DIAGNOSIS — Z23 Encounter for immunization: Secondary | ICD-10-CM | POA: Diagnosis not present

## 2017-09-02 DIAGNOSIS — S29011A Strain of muscle and tendon of front wall of thorax, initial encounter: Secondary | ICD-10-CM | POA: Diagnosis not present

## 2017-09-02 NOTE — Addendum Note (Signed)
Addended by: Gracelyn NurseBLACKWELL, Lulabelle Desta P on: 09/02/2017 10:34 AM   Modules accepted: Orders

## 2017-09-02 NOTE — Progress Notes (Signed)
   Subjective:    Patient ID: Nathaniel LoosenLarry C Mach, male    DOB: 05/07/1946, 72 y.o.   MRN: [id number]  HPI Here for 2 weeks of a mild pain in the right side area. No hx of trauma. It hurts to twist his trunk but not to take a deep breath. Using Ibuprofen.    Review of Systems  Constitutional: Negative.   Respiratory: Negative.   Cardiovascular: Positive for chest pain. Negative for palpitations and leg swelling.  Neurological: Negative.        Objective:   Physical Exam  Constitutional: He is oriented to person, place, and time. He appears well-developed and well-nourished.  Cardiovascular: Normal rate, regular rhythm, normal heart sounds and intact distal pulses.  Pulmonary/Chest: Effort normal and breath sounds normal. No respiratory distress. He has no wheezes. He has no rales.  Mildly tender in the right lower ribs, no crepitus or swelling   Neurological: He is alert and oriented to person, place, and time.          Assessment & Plan:  Intercostal strain. Rest. This should resolve over the next few weeks. Recheck prn.  Gershon CraneStephen Sayuri Rhames, MD

## 2017-09-30 ENCOUNTER — Encounter: Payer: Self-pay | Admitting: Family Medicine

## 2017-10-03 ENCOUNTER — Encounter: Payer: Self-pay | Admitting: Family Medicine

## 2017-10-03 ENCOUNTER — Ambulatory Visit (INDEPENDENT_AMBULATORY_CARE_PROVIDER_SITE_OTHER): Payer: Medicare Other | Admitting: Family Medicine

## 2017-10-03 VITALS — BP 132/68 | HR 76 | Temp 98.6°F | Ht 67.75 in | Wt 191.8 lb

## 2017-10-03 DIAGNOSIS — G8929 Other chronic pain: Secondary | ICD-10-CM

## 2017-10-03 DIAGNOSIS — M545 Low back pain: Secondary | ICD-10-CM

## 2017-10-03 DIAGNOSIS — R0781 Pleurodynia: Secondary | ICD-10-CM

## 2017-10-03 NOTE — Progress Notes (Signed)
   Subjective:    Patient ID: [name], male    DOB: Jun 04, 1945, 72 y.o.   MRN: [id number]  HPI Here to follow up on a mild dull pain in the right middle back area and right ribs that started 6 weeks ago. No hx of trauma. We saw him for this 4 weeks ago and he was slightly tender on exam only. We agreed to watch it and to follow up if it did not improve. Since then the pain has not changed at all. He ranks it a 1 on scale of 10. He usually does not treat it at all but he will occasionally take Ibuprofen, and a single Advil will stop the pain. He works out at [employer] regularly and he never has pain during a workout.    Review of Systems  Constitutional: Negative.   Respiratory: Negative.   Cardiovascular: Negative.   Musculoskeletal: Positive for back pain.  Neurological: Negative.        Objective:   Physical Exam  Constitutional: He is oriented to person, place, and time. He appears well-developed and well-nourished. No distress.  Cardiovascular: Normal rate, regular rhythm, normal heart sounds and intact distal pulses.  Pulmonary/Chest: Effort normal and breath sounds normal.  Musculoskeletal:  He has slight rotational scoliosis in the thoracic and lumbar spines. ROM is full with no pain. He is slightly tender over the right paraspinal area of the lower thoracic back area.   Neurological: He is alert and oriented to person, place, and time.          Assessment & Plan:  Back pain. He does not ask for any treatment at this time. We will get Xrays of the thoracic and lumbar spines and the right ribs.  Gershon Crane, MD

## 2017-10-06 ENCOUNTER — Ambulatory Visit (INDEPENDENT_AMBULATORY_CARE_PROVIDER_SITE_OTHER)
Admission: RE | Admit: 2017-10-06 | Discharge: 2017-10-06 | Disposition: A | Discharge status: Home / Self Care | Payer: Medicare Other | Source: Ambulatory Visit | Attending: Family Medicine | Admitting: Family Medicine

## 2017-10-06 DIAGNOSIS — G8929 Other chronic pain: Secondary | ICD-10-CM

## 2017-10-06 DIAGNOSIS — M545 Low back pain, unspecified: Secondary | ICD-10-CM

## 2017-10-06 DIAGNOSIS — R0781 Pleurodynia: Secondary | ICD-10-CM | POA: Diagnosis not present

## 2017-11-25 ENCOUNTER — Telehealth: Payer: Self-pay

## 2017-11-25 NOTE — Telephone Encounter (Signed)
Spoke with pt and advised. He states he was not told this would not be covered. His bill is for $72. He likely would not have agreed to vaccine if he had known it was not a covered immunization.   Amalia HaileyDustin - Is there anything we can do with this? THANKS!

## 2017-11-25 NOTE — Telephone Encounter (Signed)
Copied from CRM [id number]. Topic: Bill or Statement - Patient/Guarantor Inquiry >> Nov 25, 2017 10:24 AM Arlyss Gandyichardson, Taren N, NT wrote: Pt states he received a Tdap vaccine on 09/02/17 and medicare is not covering. He [facility] and was told it may be a coding issue.    [name] - Medicare does not cover TDaP. Please review note to see if any additional dx codes would be appropriate. Thanks!

## 2017-11-25 NOTE — Telephone Encounter (Signed)
Unfortunately Medicare will not cover this under any code unless he had a skin injury (which he did not)

## 2018-01-14 ENCOUNTER — Encounter: Payer: Self-pay | Admitting: Family Medicine

## 2018-01-20 ENCOUNTER — Encounter: Payer: Medicare Other | Admitting: Family Medicine

## 2018-02-10 ENCOUNTER — Ambulatory Visit (INDEPENDENT_AMBULATORY_CARE_PROVIDER_SITE_OTHER): Payer: Medicare Other

## 2018-02-10 ENCOUNTER — Encounter: Payer: Self-pay | Admitting: Family Medicine

## 2018-02-10 ENCOUNTER — Ambulatory Visit (INDEPENDENT_AMBULATORY_CARE_PROVIDER_SITE_OTHER): Payer: Medicare Other | Admitting: Family Medicine

## 2018-02-10 VITALS — BP 142/80 | HR 72 | Temp 98.5°F | Ht 67.0 in | Wt 185.1 lb

## 2018-02-10 DIAGNOSIS — R739 Hyperglycemia, unspecified: Secondary | ICD-10-CM

## 2018-02-10 DIAGNOSIS — N401 Enlarged prostate with lower urinary tract symptoms: Secondary | ICD-10-CM

## 2018-02-10 DIAGNOSIS — E782 Mixed hyperlipidemia: Secondary | ICD-10-CM | POA: Diagnosis not present

## 2018-02-10 DIAGNOSIS — Z23 Encounter for immunization: Secondary | ICD-10-CM

## 2018-02-10 DIAGNOSIS — R079 Chest pain, unspecified: Secondary | ICD-10-CM

## 2018-02-10 DIAGNOSIS — N138 Other obstructive and reflux uropathy: Secondary | ICD-10-CM | POA: Diagnosis not present

## 2018-02-10 DIAGNOSIS — I10 Essential (primary) hypertension: Secondary | ICD-10-CM | POA: Diagnosis not present

## 2018-02-10 MED ORDER — ATORVASTATIN CALCIUM 20 MG PO TABS: 20.0000 mg | ORAL_TABLET | Freq: Every day | ORAL | 3 refills | Status: DC

## 2018-02-10 MED ORDER — VALSARTAN-HYDROCHLOROTHIAZIDE 320-25 MG PO TABS: 1.0000 | ORAL_TABLET | Freq: Every day | ORAL | 3 refills | Status: DC

## 2018-02-10 MED ORDER — AMLODIPINE BESYLATE 5 MG PO TABS: 5.0000 mg | ORAL_TABLET | Freq: Every day | ORAL | 3 refills | Status: DC

## 2018-02-10 NOTE — Progress Notes (Signed)
   Subjective:    Patient ID: [name], male    DOB: 07/12/1945, 72 y.o.   MRN: [id number]  HPI Here to follow up in issues. He feels well except he still has the constant mild dull pain in the right middle back and right flank area. This started about 5 months ago. Movement or deep breathing does not affect the pain. No cough or SOB. No bowel or urinary changes. We got Xrays of the thoracic spine, lumbar spine, and right ribs. These showed extensive degenerative disc disease but no bony lesions. He is still active, works out at [employer], [employer] weights, etc. He has been seeing Dr. Tomi BambergerGeigengack this summer to treat bilateral cataracts. He monitors his BP closely at home and this has been stable in the 110-130 over 70-80 range.    Review of Systems  Constitutional: Negative.   HENT: Negative.   Eyes: Negative.   Respiratory: Negative.   Gastrointestinal: Negative.   Genitourinary: Negative.   Musculoskeletal: Positive for back pain.  Skin: Negative.   Neurological: Negative.   Psychiatric/Behavioral: Negative.        Objective:   Physical Exam  Constitutional: He is oriented to person, place, and time. He appears well-developed and well-nourished. No distress.  HENT:  Head: Normocephalic and atraumatic.  Right Ear: External ear normal.  Left Ear: External ear normal.  Nose: Nose normal.  Mouth/Throat: Oropharynx is clear and moist. No oropharyngeal exudate.  Eyes: Pupils are equal, round, and reactive to light. Conjunctivae and EOM are normal. Right eye exhibits no discharge. Left eye exhibits no discharge. No scleral icterus.  Neck: Neck supple. No JVD present. No tracheal deviation present. No thyromegaly present.  Cardiovascular: Normal rate, regular rhythm, normal heart sounds and intact distal pulses. Exam reveals no gallop and no friction rub.  No murmur heard. Pulmonary/Chest: Effort normal and breath sounds normal. No respiratory distress. He has no wheezes. He has no rales.  He exhibits no tenderness.  Abdominal: Soft. Bowel sounds are normal. He exhibits no distension and no mass. There is no tenderness. There is no rebound and no guarding.  Genitourinary: Rectum normal, prostate normal and penis normal. Rectal exam shows guaiac negative stool. No penile tenderness.  Musculoskeletal: Normal range of motion. He exhibits no edema or tenderness.  Lymphadenopathy:    He has no cervical adenopathy.  Neurological: He is alert and oriented to person, place, and time. He has normal reflexes. He displays normal reflexes. No cranial nerve deficit. He exhibits normal muscle tone. Coordination normal.  Skin: Skin is warm and dry. No rash noted. He is not diaphoretic. No erythema. No pallor.  Psychiatric: He has a normal mood and affect. His behavior is normal. Judgment and thought content normal.          Assessment & Plan:  His HTN is stable. For the flank and back pain he will get a CXR today. Set up fasting labs soon to check his glucose, lipids, etc.  Gershon CraneStephen Clarie Camey, MD

## 2018-02-11 LAB — POC URINALSYSI DIPSTICK (AUTOMATED)
Bilirubin, UA: NEGATIVE
GLUCOSE UA: NEGATIVE
Ketones, UA: NEGATIVE
Leukocytes, UA: NEGATIVE
NITRITE UA: NEGATIVE
PH UA: 6.5 (ref 5.0–8.0)
PROTEIN UA: NEGATIVE
RBC UA: NEGATIVE
SPEC GRAV UA: 1.02 (ref 1.010–1.025)
UROBILINOGEN UA: 0.2 U/dL

## 2018-02-11 LAB — HEPATIC FUNCTION PANEL
ALBUMIN: 4.2 g/dL (ref 3.5–5.2)
ALT: 29 U/L (ref 0–53)
AST: 23 U/L (ref 0–37)
Alkaline Phosphatase: 37 U/L — ABNORMAL LOW (ref 39–117)
BILIRUBIN TOTAL: 1.2 mg/dL (ref 0.2–1.2)
Bilirubin, Direct: 0.2 mg/dL (ref 0.0–0.3)
Total Protein: 6.6 g/dL (ref 6.0–8.3)

## 2018-02-11 LAB — CBC WITH DIFFERENTIAL/PLATELET
BASOS PCT: 0.8 % (ref 0.0–3.0)
Basophils Absolute: 0.1 10*3/uL (ref 0.0–0.1)
EOS ABS: 0.2 10*3/uL (ref 0.0–0.7)
Eosinophils Relative: 2.7 % (ref 0.0–5.0)
HEMATOCRIT: 42.8 % (ref 39.0–52.0)
Hemoglobin: 15.1 g/dL (ref 13.0–17.0)
LYMPHS PCT: 29 % (ref 12.0–46.0)
Lymphs Abs: 1.9 10*3/uL (ref 0.7–4.0)
MCHC: 35.1 g/dL (ref 30.0–36.0)
MCV: 89.7 fl (ref 78.0–100.0)
MONO ABS: 0.8 10*3/uL (ref 0.1–1.0)
Monocytes Relative: 13.1 % — ABNORMAL HIGH (ref 3.0–12.0)
NEUTROS ABS: 3.5 10*3/uL (ref 1.4–7.7)
NEUTROS PCT: 54.4 % (ref 43.0–77.0)
PLATELETS: 193 10*3/uL (ref 150.0–400.0)
RBC: 4.77 Mil/uL (ref 4.22–5.81)
RDW: 13.8 % (ref 11.5–15.5)
WBC: 6.4 10*3/uL (ref 4.0–10.5)

## 2018-02-11 LAB — LIPID PANEL
CHOL/HDL RATIO: 5
CHOLESTEROL: 145 mg/dL (ref 0–200)
HDL: 30 mg/dL — AB (ref >39.00)
NONHDL: 114.68
TRIGLYCERIDES: 203 mg/dL — AB (ref 0.0–149.0)
VLDL: 40.6 mg/dL — ABNORMAL HIGH (ref 0.0–40.0)

## 2018-02-11 LAB — BASIC METABOLIC PANEL
BUN: 19 mg/dL (ref 6–23)
CHLORIDE: 102 meq/L (ref 96–112)
CO2: 32 meq/L (ref 19–32)
CREATININE: 0.86 mg/dL (ref 0.40–1.50)
Calcium: 9.3 mg/dL (ref 8.4–10.5)
GFR: 92.49 mL/min (ref >60.00)
Glucose, Bld: 132 mg/dL — ABNORMAL HIGH (ref 70–99)
Potassium: 3.9 mEq/L (ref 3.5–5.1)
SODIUM: 139 meq/L (ref 135–145)

## 2018-02-11 LAB — HEMOGLOBIN A1C: Hgb A1c MFr Bld: 6.7 % — ABNORMAL HIGH (ref 4.6–6.5)

## 2018-02-11 LAB — TSH: TSH: 1.19 u[IU]/mL (ref 0.35–4.50)

## 2018-02-11 LAB — PSA: PSA: 0.38 ng/mL (ref 0.10–4.00)

## 2018-02-11 LAB — LDL CHOLESTEROL, DIRECT: Direct LDL: 81 mg/dL

## 2018-02-11 NOTE — Addendum Note (Signed)
Addended by: Bonnye FavaKWEI, NANA K on: 02/11/2018 07:50 AM   Modules accepted: Orders

## 2018-02-18 ENCOUNTER — Other Ambulatory Visit: Payer: Self-pay | Admitting: Family Medicine

## 2018-02-18 DIAGNOSIS — R7309 Other abnormal glucose: Secondary | ICD-10-CM

## 2018-02-18 MED ORDER — METFORMIN HCL 500 MG PO TABS: 500.0000 mg | ORAL_TABLET | Freq: Two times a day (BID) | ORAL | 5 refills | Status: DC

## 2018-06-02 ENCOUNTER — Other Ambulatory Visit (INDEPENDENT_AMBULATORY_CARE_PROVIDER_SITE_OTHER): Payer: Medicare Other

## 2018-06-02 DIAGNOSIS — R7309 Other abnormal glucose: Secondary | ICD-10-CM

## 2018-06-02 LAB — HEMOGLOBIN A1C: HEMOGLOBIN A1C: 6.5 % (ref 4.6–6.5)

## 2018-06-04 ENCOUNTER — Encounter: Payer: Self-pay | Admitting: Family Medicine

## 2018-06-05 ENCOUNTER — Encounter: Payer: Self-pay | Admitting: *Deleted

## 2018-06-05 MED ORDER — METFORMIN HCL 500 MG PO TABS: 500.0000 mg | ORAL_TABLET | Freq: Two times a day (BID) | ORAL | 1 refills | Status: DC

## 2018-06-05 MED ORDER — ATORVASTATIN CALCIUM 20 MG PO TABS: 20.0000 mg | ORAL_TABLET | Freq: Every day | ORAL | 3 refills | Status: DC

## 2018-06-05 MED ORDER — AMLODIPINE BESYLATE 5 MG PO TABS: 5.0000 mg | ORAL_TABLET | Freq: Every day | ORAL | 3 refills | Status: DC

## 2018-06-05 MED ORDER — VALSARTAN-HYDROCHLOROTHIAZIDE 320-25 MG PO TABS: 1.0000 | ORAL_TABLET | Freq: Every day | ORAL | 3 refills | Status: DC

## 2018-06-05 NOTE — Telephone Encounter (Signed)
Dr. Fry please advise. Thanks  

## 2018-06-05 NOTE — Telephone Encounter (Signed)
I would stay on the Metformin because in the long run this will increase your insulin sensitivity. Certainly go ahead and make the dietary changes your are referring to

## 2018-07-01 ENCOUNTER — Encounter: Payer: Self-pay | Admitting: Family Medicine

## 2018-07-01 NOTE — Telephone Encounter (Signed)
Dr. Fry please advise. Thanks  

## 2018-07-02 NOTE — Telephone Encounter (Signed)
Yes it is okay to take this. No interference with Metformin. Let's hold off on a refill for now

## 2018-07-13 ENCOUNTER — Ambulatory Visit (INDEPENDENT_AMBULATORY_CARE_PROVIDER_SITE_OTHER): Payer: Medicare Other | Admitting: Family Medicine

## 2018-07-13 ENCOUNTER — Encounter: Payer: Self-pay | Admitting: Family Medicine

## 2018-07-13 VITALS — BP 140/82 | HR 75 | Temp 98.4°F | Wt 187.5 lb

## 2018-07-13 DIAGNOSIS — M542 Cervicalgia: Secondary | ICD-10-CM

## 2018-07-13 DIAGNOSIS — M25551 Pain in right hip: Secondary | ICD-10-CM | POA: Diagnosis not present

## 2018-07-13 MED ORDER — DICLOFENAC SODIUM 75 MG PO TBEC: 75.0000 mg | DELAYED_RELEASE_TABLET | Freq: Two times a day (BID) | ORAL | 2 refills | Status: DC

## 2018-07-13 MED ORDER — METHYLPREDNISOLONE ACETATE 80 MG/ML IJ SUSP
120.0000 mg | Freq: Once | INTRAMUSCULAR | Status: AC
  Administered 2018-07-13: 120 mg via INTRAMUSCULAR

## 2018-07-13 NOTE — Progress Notes (Signed)
   Subjective:    Patient ID: [name], male    DOB: 07/13/1945, 73 y.o.   MRN: [id number]  HPI Here for some pains that started a few weeks ago. He has been very active lately with cutting down trees and moving rocks around on his property. He has developed pain in the right hip as well as pain and stiffness in the neck. He had a prednisone taper at home and he took this last week. This helped a lot with the pains. Now they are back and he has taken a few Vicodin tablets for this.     Review of Systems  Constitutional: Negative.   Respiratory: Negative.   Cardiovascular: Negative.   Musculoskeletal: Positive for arthralgias, neck pain and neck stiffness.       Objective:   Physical Exam Constitutional:      Appearance: Normal appearance.     Comments: Walks with a slight limp and uses a walking stick   Cardiovascular:     Rate and Rhythm: Normal rate and regular rhythm.     Pulses: Normal pulses.     Heart sounds: Normal heart sounds.  Pulmonary:     Effort: Pulmonary effort is normal.     Breath sounds: Normal breath sounds.  Musculoskeletal:     Comments: He is tender over the posterior neck and both upper trapezei. ROM of the neck is quite limited. The right hip has full ROM but he has pain on flexing it.   Neurological:     Mental Status: He is alert.           Assessment & Plan:  He has neck pain and right hip pain, likely from osteoarthritis. He needs to rest and apply moist heat. Try Diclofenac bid as needed. Given a steroid shot.  Gershon Crane, MD

## 2018-07-13 NOTE — Addendum Note (Signed)
Addended by: Marcellus Scott on: 07/13/2018 10:22 AM   Modules accepted: Orders

## 2018-11-02 ENCOUNTER — Other Ambulatory Visit: Payer: Self-pay | Admitting: Family Medicine

## 2019-03-12 ENCOUNTER — Other Ambulatory Visit: Payer: Self-pay

## 2019-03-12 ENCOUNTER — Encounter: Payer: Self-pay | Admitting: Family Medicine

## 2019-03-12 ENCOUNTER — Ambulatory Visit (INDEPENDENT_AMBULATORY_CARE_PROVIDER_SITE_OTHER): Payer: Medicare Other | Admitting: Family Medicine

## 2019-03-12 VITALS — BP 120/60 | HR 65 | Temp 98.0°F | Ht 67.0 in | Wt 183.0 lb

## 2019-03-12 DIAGNOSIS — I10 Essential (primary) hypertension: Secondary | ICD-10-CM | POA: Diagnosis not present

## 2019-03-12 DIAGNOSIS — R739 Hyperglycemia, unspecified: Secondary | ICD-10-CM

## 2019-03-12 DIAGNOSIS — E782 Mixed hyperlipidemia: Secondary | ICD-10-CM

## 2019-03-12 DIAGNOSIS — N138 Other obstructive and reflux uropathy: Secondary | ICD-10-CM

## 2019-03-12 DIAGNOSIS — N401 Enlarged prostate with lower urinary tract symptoms: Secondary | ICD-10-CM | POA: Diagnosis not present

## 2019-03-12 DIAGNOSIS — Z23 Encounter for immunization: Secondary | ICD-10-CM | POA: Diagnosis not present

## 2019-03-12 LAB — POC URINALSYSI DIPSTICK (AUTOMATED)
Bilirubin, UA: NEGATIVE
Blood, UA: NEGATIVE
Glucose, UA: NEGATIVE
Ketones, UA: NEGATIVE
Leukocytes, UA: NEGATIVE
Nitrite, UA: NEGATIVE
Protein, UA: NEGATIVE
Spec Grav, UA: 1.02 (ref 1.010–1.025)
Urobilinogen, UA: 0.2 E.U./dL
pH, UA: 6 (ref 5.0–8.0)

## 2019-03-12 LAB — CBC WITH DIFFERENTIAL/PLATELET
Basophils Absolute: 0.1 10*3/uL (ref 0.0–0.1)
Basophils Relative: 1.1 % (ref 0.0–3.0)
Eosinophils Absolute: 0.2 10*3/uL (ref 0.0–0.7)
Eosinophils Relative: 2.9 % (ref 0.0–5.0)
HCT: 41.9 % (ref 39.0–52.0)
Hemoglobin: 14.5 g/dL (ref 13.0–17.0)
Lymphocytes Relative: 30.9 % (ref 12.0–46.0)
Lymphs Abs: 2 10*3/uL (ref 0.7–4.0)
MCHC: 34.6 g/dL (ref 30.0–36.0)
MCV: 90.7 fl (ref 78.0–100.0)
Monocytes Absolute: 0.9 10*3/uL (ref 0.1–1.0)
Monocytes Relative: 13.5 % — ABNORMAL HIGH (ref 3.0–12.0)
Neutro Abs: 3.3 10*3/uL (ref 1.4–7.7)
Neutrophils Relative %: 51.6 % (ref 43.0–77.0)
Platelets: 203 10*3/uL (ref 150.0–400.0)
RBC: 4.62 Mil/uL (ref 4.22–5.81)
RDW: 13.7 % (ref 11.5–15.5)
WBC: 6.4 10*3/uL (ref 4.0–10.5)

## 2019-03-12 LAB — BASIC METABOLIC PANEL
BUN: 19 mg/dL (ref 6–23)
CO2: 28 mEq/L (ref 19–32)
Calcium: 9.6 mg/dL (ref 8.4–10.5)
Chloride: 104 mEq/L (ref 96–112)
Creatinine, Ser: 0.75 mg/dL (ref 0.40–1.50)
GFR: 101.61 mL/min (ref >60.00)
Glucose, Bld: 113 mg/dL — ABNORMAL HIGH (ref 70–99)
Potassium: 4.3 mEq/L (ref 3.5–5.1)
Sodium: 142 mEq/L (ref 135–145)

## 2019-03-12 LAB — HEPATIC FUNCTION PANEL
ALT: 29 U/L (ref 0–53)
AST: 22 U/L (ref 0–37)
Albumin: 4.5 g/dL (ref 3.5–5.2)
Alkaline Phosphatase: 43 U/L (ref 39–117)
Bilirubin, Direct: 0.2 mg/dL (ref 0.0–0.3)
Total Bilirubin: 1.3 mg/dL — ABNORMAL HIGH (ref 0.2–1.2)
Total Protein: 6.8 g/dL (ref 6.0–8.3)

## 2019-03-12 LAB — LIPID PANEL
Cholesterol: 156 mg/dL (ref 0–200)
HDL: 32.6 mg/dL — ABNORMAL LOW (ref >39.00)
LDL Cholesterol: 89 mg/dL (ref 0–99)
NonHDL: 123.27
Total CHOL/HDL Ratio: 5
Triglycerides: 173 mg/dL — ABNORMAL HIGH (ref 0.0–149.0)
VLDL: 34.6 mg/dL (ref 0.0–40.0)

## 2019-03-12 LAB — HEMOGLOBIN A1C: Hgb A1c MFr Bld: 6.8 % — ABNORMAL HIGH (ref 4.6–6.5)

## 2019-03-12 LAB — PSA: PSA: 0.39 ng/mL (ref 0.10–4.00)

## 2019-03-12 LAB — TSH: TSH: 1.07 u[IU]/mL (ref 0.35–4.50)

## 2019-03-12 NOTE — Progress Notes (Signed)
Subjective:    Patient ID: [name], male    DOB: 1946/01/04, 73 y.o.   MRN: [id number]  HPI Here to follow up on issues. He feels well in general. He is seeing Dr. Ulis Rias for knee pain and he has received several injections. He walks on a treadmill for exercise, but the pain is making this difficult. His BP is steady.    Review of Systems  Constitutional: Negative.   HENT: Negative.   Eyes: Negative.   Respiratory: Negative.   Cardiovascular: Negative.   Gastrointestinal: Negative.   Genitourinary: Negative.   Musculoskeletal: Positive for arthralgias.  Skin: Negative.   Neurological: Negative.   Psychiatric/Behavioral: Negative.        Objective:   Physical Exam Constitutional:      General: He is not in acute distress.    Appearance: He is well-developed. He is not diaphoretic.  HENT:     Head: Normocephalic and atraumatic.     Right Ear: External ear normal.     Left Ear: External ear normal.     Nose: Nose normal.     Mouth/Throat:     Pharynx: No oropharyngeal exudate.  Eyes:     General: No scleral icterus.       Right eye: No discharge.        Left eye: No discharge.     Conjunctiva/sclera: Conjunctivae normal.     Pupils: Pupils are equal, round, and reactive to light.  Neck:     Musculoskeletal: Neck supple.     Thyroid: No thyromegaly.     Vascular: No JVD.     Trachea: No tracheal deviation.  Cardiovascular:     Rate and Rhythm: Normal rate and regular rhythm.     Heart sounds: Normal heart sounds. No murmur. No friction rub. No gallop.   Pulmonary:     Effort: Pulmonary effort is normal. No respiratory distress.     Breath sounds: Normal breath sounds. No wheezing or rales.  Chest:     Chest wall: No tenderness.  Abdominal:     General: Bowel sounds are normal. There is no distension.     Palpations: Abdomen is soft. There is no mass.     Tenderness: There is no abdominal tenderness. There is no guarding or rebound.  Genitourinary:  Penis: Normal. No tenderness.      Scrotum/Testes: Normal.     Prostate: Normal.     Rectum: Normal. Guaiac result negative.  Musculoskeletal: Normal range of motion.        General: No tenderness.  Lymphadenopathy:     Cervical: No cervical adenopathy.  Skin:    General: Skin is warm and dry.     Coloration: Skin is not pale.     Findings: No erythema or rash.  Neurological:     Mental Status: He is alert and oriented to person, place, and time.     Cranial Nerves: No cranial nerve deficit.     Motor: No abnormal muscle tone.     Coordination: Coordination normal.     Deep Tendon Reflexes: Reflexes are normal and symmetric. Reflexes normal.  Psychiatric:        Behavior: Behavior normal.        Thought Content: Thought content normal.        Judgment: Judgment normal.           Assessment & Plan:  His HTN is stable. We will get fasting labs to check lipids, an A1c, etc. For  cardiovascular exercise I suggested he switch to a recumbent bicycle to make it easier on the knees.  Alysia Penna, MD

## 2019-04-20 ENCOUNTER — Other Ambulatory Visit: Payer: Self-pay | Admitting: Family Medicine

## 2019-05-18 ENCOUNTER — Encounter: Payer: Self-pay | Admitting: Family Medicine

## 2019-05-18 NOTE — Telephone Encounter (Signed)
No, he does not need to be fasting

## 2019-05-18 NOTE — Telephone Encounter (Signed)
Make an in person OV for this so I can get an EKG for clearance. We will get the labs at the same time

## 2019-05-24 ENCOUNTER — Encounter: Payer: Self-pay | Admitting: Family Medicine

## 2019-05-24 ENCOUNTER — Other Ambulatory Visit: Payer: Self-pay

## 2019-05-24 ENCOUNTER — Ambulatory Visit: Payer: Medicare Other | Admitting: Family Medicine

## 2019-05-24 LAB — BASIC METABOLIC PANEL
BUN: 19 (ref 4–21)
Chloride: 105 (ref 99–108)
Creatinine: 0.7 (ref <=1.3)
Glucose: 131
Potassium: 4.5 (ref 3.4–5.3)
Sodium: 142 (ref 137–147)

## 2019-05-24 LAB — CBC AND DIFFERENTIAL
HCT: 45 (ref 41–53)
Hemoglobin: 15.4 (ref 13.5–17.5)
Neutrophils Absolute: 2734
Platelets: 217 (ref 150–399)
WBC: 5.5

## 2019-05-24 LAB — POCT INR: INR: 1 (ref <=1.1)

## 2019-05-24 LAB — HEPATIC FUNCTION PANEL
ALT: 35 (ref 10–40)
AST: 24 (ref 14–40)
Alkaline Phosphatase: 44 (ref 25–125)
Bilirubin, Total: 0.7

## 2019-05-24 LAB — COMPREHENSIVE METABOLIC PANEL: Albumin: 4.5 (ref 3.5–5.0)

## 2019-05-24 LAB — HEMOGLOBIN A1C: Hemoglobin A1C: 6.1

## 2019-05-24 LAB — CBC: RBC: 5.03 (ref 3.87–5.11)

## 2019-05-25 ENCOUNTER — Other Ambulatory Visit: Payer: Self-pay

## 2019-05-25 ENCOUNTER — Encounter: Payer: Self-pay | Admitting: Family Medicine

## 2019-05-25 ENCOUNTER — Ambulatory Visit (INDEPENDENT_AMBULATORY_CARE_PROVIDER_SITE_OTHER): Payer: Medicare Other | Admitting: Family Medicine

## 2019-05-25 VITALS — BP 138/86 | HR 72 | Temp 97.9°F | Ht 67.0 in | Wt 178.7 lb

## 2019-05-25 DIAGNOSIS — I10 Essential (primary) hypertension: Secondary | ICD-10-CM | POA: Diagnosis not present

## 2019-05-25 DIAGNOSIS — E782 Mixed hyperlipidemia: Secondary | ICD-10-CM

## 2019-05-25 DIAGNOSIS — R739 Hyperglycemia, unspecified: Secondary | ICD-10-CM

## 2019-05-25 DIAGNOSIS — Z01818 Encounter for other preprocedural examination: Secondary | ICD-10-CM

## 2019-05-25 NOTE — Progress Notes (Signed)
  Subjective:     Patient ID: [name], male   DOB: January 06, 1946, 73 y.o.   MRN: [id number]  HPI   [name] is here for presurgical clearance.  He has been scheduled for left total knee replacement on 06/01/2019.  His medical problems include history of type 2 diabetes, hypertension, and hyperlipidemia.  Medications reviewed.  His blood sugars have been well controlled.  He had recent labs just yesterday and A1c was 6.1%.  His renal function and electrolytes were stable.  INR was normal.  CBC normal with hemoglobin 15.4 and hematocrit 44.7.  White count normal.  Generally feels well this time.  No recent chest pains.  No dyspnea.  Past Medical History:  Diagnosis Date  . Hyperlipidemia   . Hypertension   . Skin lesion    sees Dr. Crista Luria    Past Surgical History:  Procedure Laterality Date  . colonoscopy  12-28-09   per Dr. Olevia Perches, diverticulosis, repeat in 10 yrs   . SKIN CANCER EXCISION     rt cheek    reports that he has never smoked. He has never used smokeless tobacco. He reports current alcohol use. He reports that he does not use drugs. family history is not on file. No Known Allergies   Review of Systems  Constitutional: Negative for chills, fatigue, fever and unexpected weight change.  Eyes: Negative for visual disturbance.  Respiratory: Negative for cough, chest tightness, shortness of breath and wheezing.   Cardiovascular: Negative for chest pain, palpitations and leg swelling.  Neurological: Negative for dizziness, syncope, weakness, light-headedness and headaches.       Objective:   Physical Exam Vitals reviewed.  Constitutional:      Appearance: Normal appearance.  Eyes:     Pupils: Pupils are equal, round, and reactive to light.  Neck:     Comments: No carotid bruits Cardiovascular:     Rate and Rhythm: Normal rate and regular rhythm.  Pulmonary:     Effort: Pulmonary effort is normal.     Breath sounds: Normal breath sounds.  Musculoskeletal:   Cervical back: Neck supple.     Right lower leg: No edema.     Left lower leg: No edema.  Neurological:     General: No focal deficit present.     Mental Status: He is alert.     Cranial Nerves: No cranial nerve deficit.        Assessment:     Presurgical clearance for total knee replacement.  He has type 2 diabetes which is well controlled.  No history of known cardiovascular disease.  Hypertension which has been stable and he monitors this regularly.    Plan:     -Check EKG-shows normal sinus rhythm with no acute changes and no significant changes compared to prior tracing 12/19/2015. -Patient knows to stop aspirin and fish oil supplement at least 5 days prior to surgery -No known contraindications to surgery at this time.  We reviewed recent lab work with patient and no significant concerns  Eulas Post MD [facility]

## 2019-05-26 ENCOUNTER — Ambulatory Visit: Payer: Medicare Other | Admitting: Family Medicine

## 2019-06-21 ENCOUNTER — Encounter: Payer: Self-pay | Admitting: Family Medicine

## 2019-06-22 NOTE — Telephone Encounter (Signed)
We totally agree. The state screwed up big time. As for [facility] vaccine, we do not know when more will be available. Watch the web site

## 2019-07-10 IMAGING — DX DG THORACIC SPINE 2V
2 series · 2 of 2 positions shown · non-contrast
Comparison: None in PACs

CLINICAL DATA: Back pain for the past 6 weeks also lower ribcage
discomfort on the right. No known injury.

EXAM:
THORACIC SPINE 2 VIEWS

[t-spine ap]
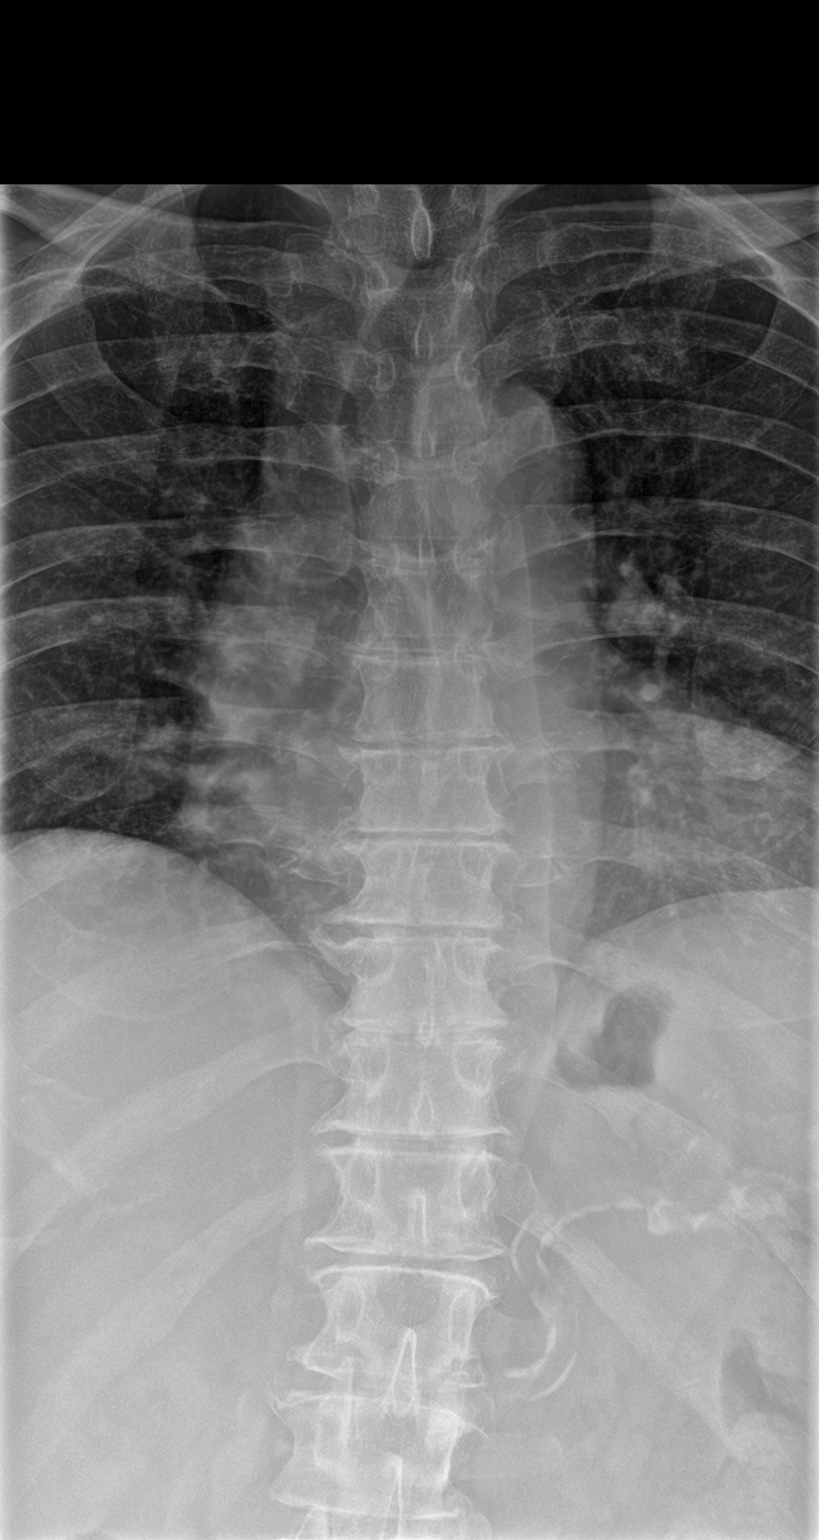

[t-spine lat]
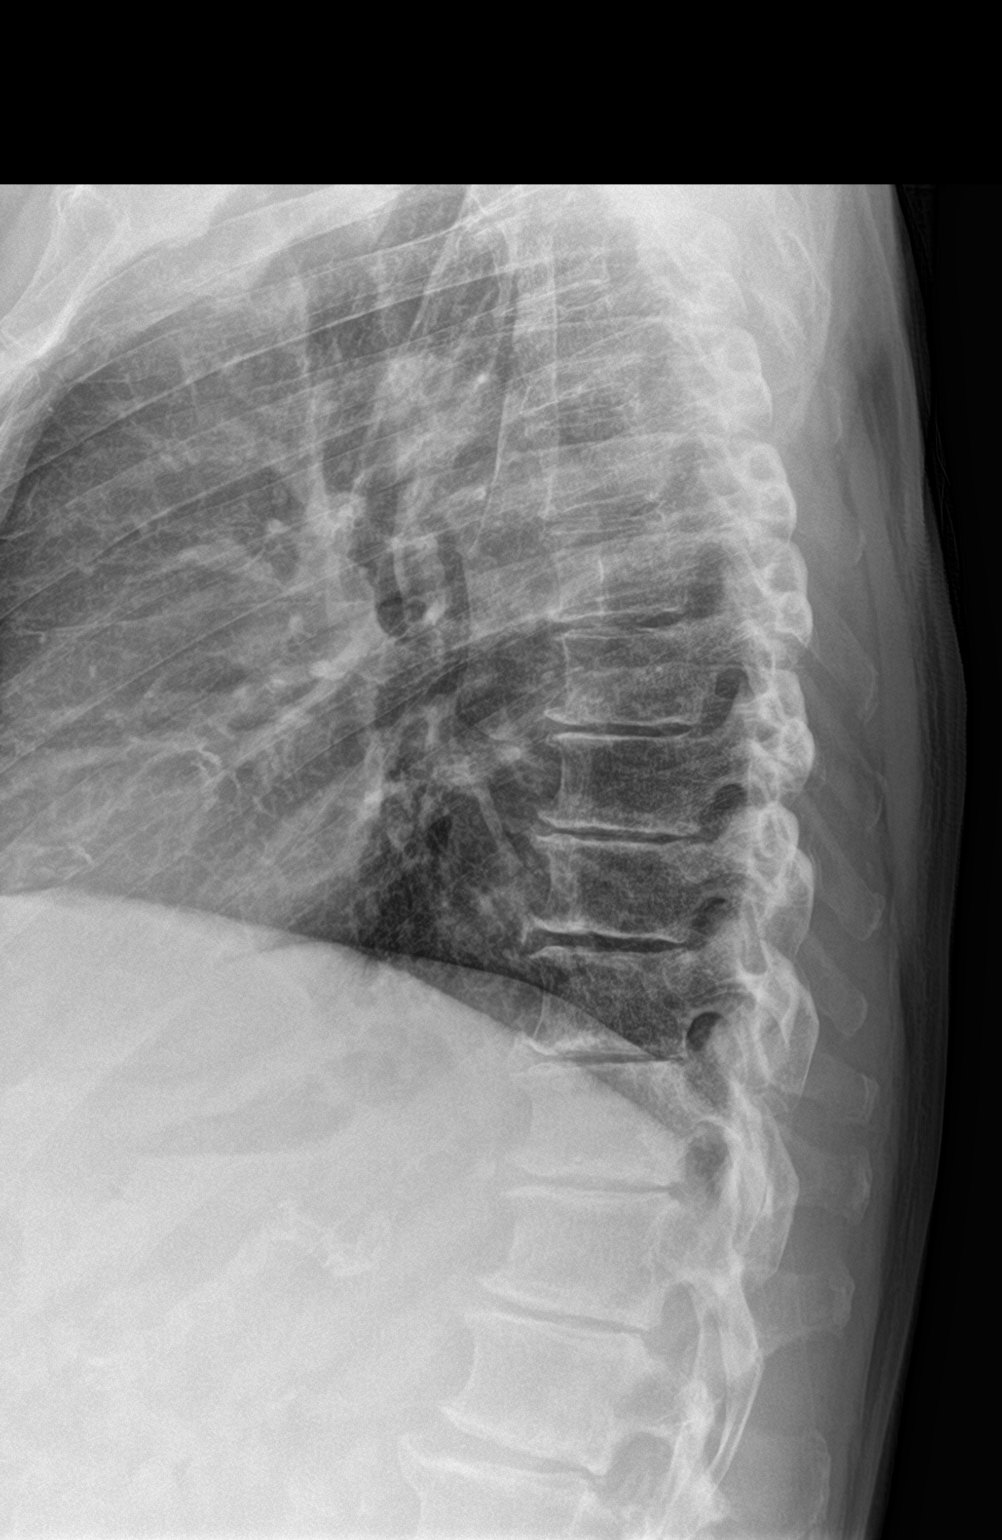

[2 of 2 positions shown; findings below may reference images not displayed]

FINDINGS: The thoracic vertebral bodies are preserved in height. There is
gentle curvature convex toward the right that begins at
approximately T12 and extends into the lumbar region. The pedicles
are intact. There are small anterior and lateral endplate spurs at
multiple thoracic levels. There are no abnormal paravertebral soft
tissue densities. Incidental note is made of mild deviation of the
trachea toward the left at the level of the thoracic inlet.
IMPRESSION: Mild multilevel degenerative disc disease of the thoracic spine. No
compression fracture.

## 2019-07-16 ENCOUNTER — Encounter: Payer: Self-pay | Admitting: Family Medicine

## 2019-07-18 ENCOUNTER — Other Ambulatory Visit: Payer: Self-pay | Admitting: Family Medicine

## 2019-09-12 ENCOUNTER — Other Ambulatory Visit: Payer: Self-pay | Admitting: Family Medicine

## 2019-11-14 IMAGING — DX DG CHEST 2V
2 series · 2 of 2 positions shown · non-contrast
Comparison: None.

CLINICAL DATA: Right-sided chest pain for 8 months.

EXAM:
CHEST - 2 VIEW

[chest pa]
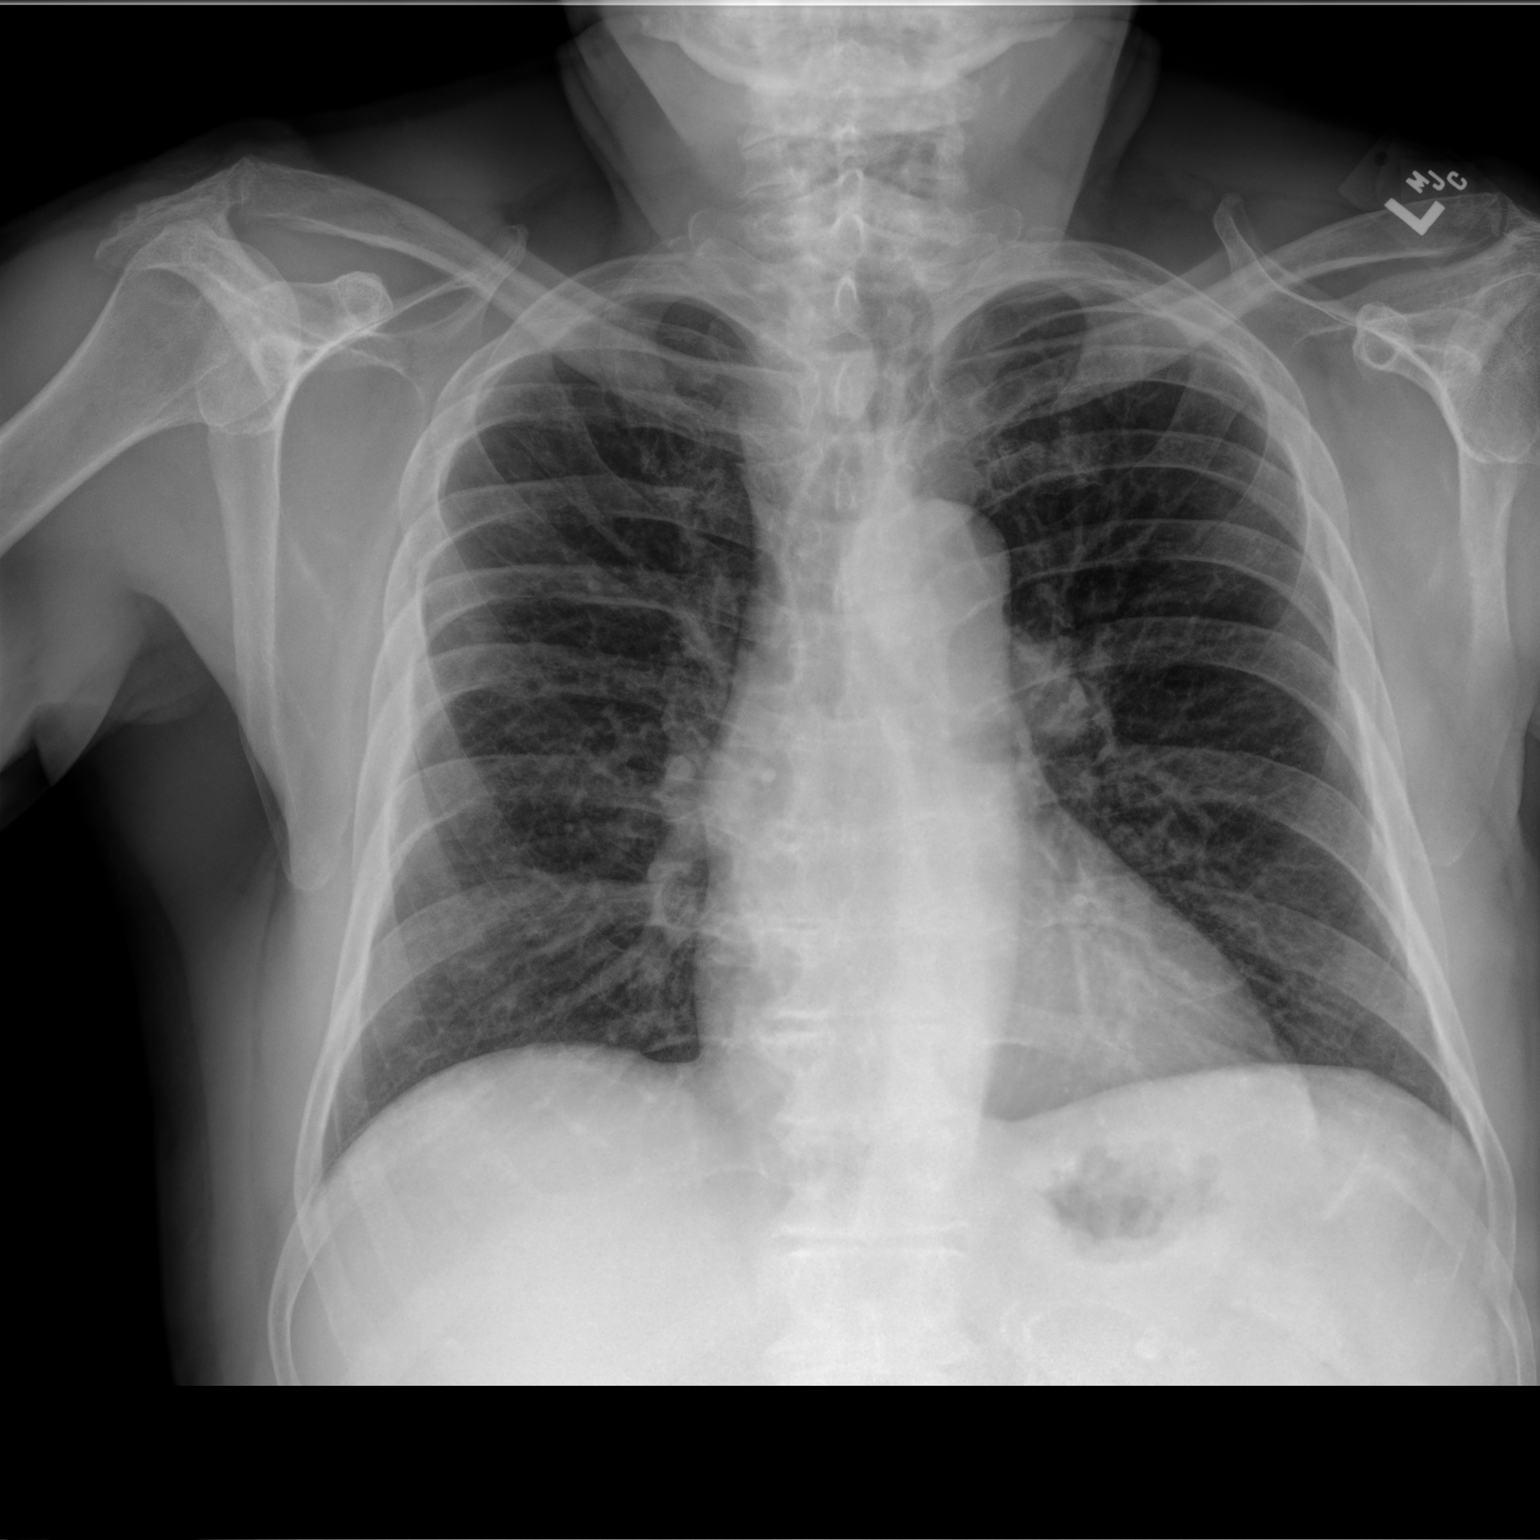

[chest lat]
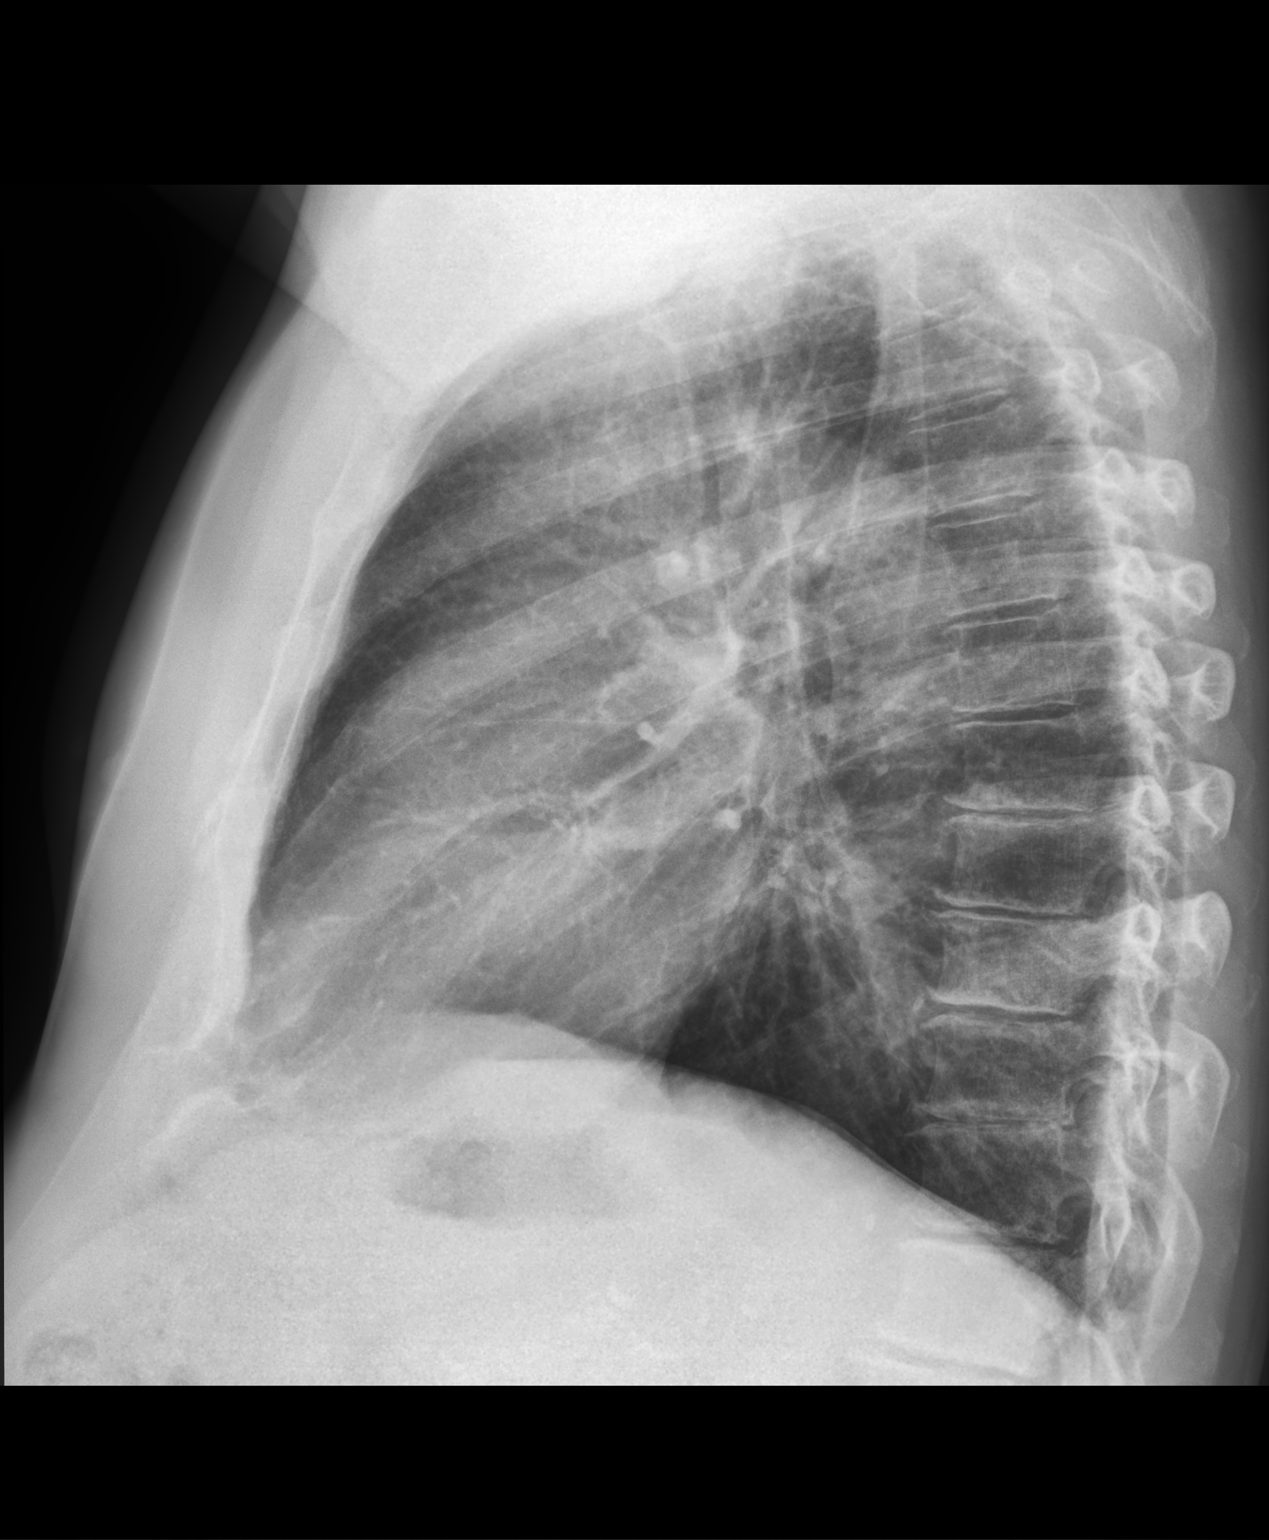

[2 of 2 positions shown; findings below may reference images not displayed]

FINDINGS: The heart size and mediastinal contours are within normal limits.
Both lungs are clear. The visualized skeletal structures are
unremarkable.
IMPRESSION: Negative.  No active cardiopulmonary disease.

## 2019-11-21 ENCOUNTER — Other Ambulatory Visit: Payer: Self-pay | Admitting: Family Medicine

## 2020-02-01 ENCOUNTER — Encounter: Payer: Self-pay | Admitting: Family Medicine

## 2020-02-02 NOTE — Telephone Encounter (Signed)
Several answers here. (1) no I cannot order anything for his wife since she is NOT my patient, (2) yes he can schedule to have his Shingrix shots here if he wishes, (3) no I will never prescribe Ivermectin which is not approved for this use and which can have serious side effects, and (4) no I cannot order monoclonal antibodies unless you have an active Covid infection

## 2020-02-03 ENCOUNTER — Other Ambulatory Visit: Payer: Self-pay | Admitting: Family Medicine

## 2020-03-01 ENCOUNTER — Encounter: Payer: Self-pay | Admitting: Family Medicine

## 2020-03-31 ENCOUNTER — Other Ambulatory Visit: Payer: Self-pay

## 2020-03-31 ENCOUNTER — Encounter: Payer: Self-pay | Admitting: Family Medicine

## 2020-03-31 ENCOUNTER — Ambulatory Visit (INDEPENDENT_AMBULATORY_CARE_PROVIDER_SITE_OTHER): Payer: Medicare Other | Admitting: Family Medicine

## 2020-03-31 VITALS — BP 158/88 | HR 68 | Temp 98.2°F | Ht 67.0 in | Wt 179.0 lb

## 2020-03-31 DIAGNOSIS — Z0000 Encounter for general adult medical examination without abnormal findings: Secondary | ICD-10-CM

## 2020-03-31 DIAGNOSIS — I10 Essential (primary) hypertension: Secondary | ICD-10-CM | POA: Diagnosis not present

## 2020-03-31 DIAGNOSIS — E782 Mixed hyperlipidemia: Secondary | ICD-10-CM

## 2020-03-31 DIAGNOSIS — R739 Hyperglycemia, unspecified: Secondary | ICD-10-CM

## 2020-03-31 DIAGNOSIS — N138 Other obstructive and reflux uropathy: Secondary | ICD-10-CM

## 2020-03-31 DIAGNOSIS — N401 Enlarged prostate with lower urinary tract symptoms: Secondary | ICD-10-CM | POA: Diagnosis not present

## 2020-03-31 MED ORDER — AMLODIPINE BESYLATE 5 MG PO TABS: 5.0000 mg | ORAL_TABLET | Freq: Every day | ORAL | 3 refills | Status: DC

## 2020-03-31 MED ORDER — METFORMIN HCL 500 MG PO TABS: 500.0000 mg | ORAL_TABLET | Freq: Two times a day (BID) | ORAL | 3 refills | Status: DC

## 2020-03-31 MED ORDER — ATORVASTATIN CALCIUM 20 MG PO TABS: 20.0000 mg | ORAL_TABLET | Freq: Every day | ORAL | 3 refills | Status: DC

## 2020-03-31 NOTE — Progress Notes (Signed)
Subjective:    Patient ID: [name], male    DOB: 11-28-1945, 74 y.o.   MRN: [id number]  HPI Here to follow up on issues. He feels well. His BP has been stable in the 120s and 130s systolic. He exercises 4-5 days a week. He watches his diet closely.   Review of Systems  Constitutional: Negative.   HENT: Negative.   Eyes: Negative.   Respiratory: Negative.   Cardiovascular: Negative.   Gastrointestinal: Negative.   Genitourinary: Negative.   Musculoskeletal: Negative.   Skin: Negative.   Neurological: Negative.   Psychiatric/Behavioral: Negative.        Objective:   Physical Exam Constitutional:      General: He is not in acute distress.    Appearance: He is well-developed. He is not diaphoretic.  HENT:     Head: Normocephalic and atraumatic.     Right Ear: External ear normal.     Left Ear: External ear normal.     Nose: Nose normal.     Mouth/Throat:     Pharynx: No oropharyngeal exudate.  Eyes:     General: No scleral icterus.       Right eye: No discharge.        Left eye: No discharge.     Conjunctiva/sclera: Conjunctivae normal.     Pupils: Pupils are equal, round, and reactive to light.  Neck:     Thyroid: No thyromegaly.     Vascular: No JVD.     Trachea: No tracheal deviation.  Cardiovascular:     Rate and Rhythm: Normal rate and regular rhythm.     Heart sounds: Normal heart sounds. No murmur heard.  No friction rub. No gallop.   Pulmonary:     Effort: Pulmonary effort is normal. No respiratory distress.     Breath sounds: Normal breath sounds. No wheezing or rales.  Chest:     Chest wall: No tenderness.  Abdominal:     General: Bowel sounds are normal. There is no distension.     Palpations: Abdomen is soft. There is no mass.     Tenderness: There is no abdominal tenderness. There is no guarding or rebound.  Genitourinary:    Penis: Normal. No tenderness.      Testes: Normal.     Prostate: Normal.     Rectum: Normal. Guaiac result  negative.  Musculoskeletal:        General: No tenderness. Normal range of motion.     Cervical back: Neck supple.  Lymphadenopathy:     Cervical: No cervical adenopathy.  Skin:    General: Skin is warm and dry.     Coloration: Skin is not pale.     Findings: No erythema or rash.  Neurological:     Mental Status: He is alert and oriented to person, place, and time.     Cranial Nerves: No cranial nerve deficit.     Motor: No abnormal muscle tone.     Coordination: Coordination normal.     Deep Tendon Reflexes: Reflexes are normal and symmetric. Reflexes normal.  Psychiatric:        Behavior: Behavior normal.        Thought Content: Thought content normal.        Judgment: Judgment normal.           Assessment & Plan:  His HTN is well controlled. We will get fasting labs to check lipids, an A1c, etc. He is given a flu shot. Set up  another colonoscopy.  Gershon Crane, MD

## 2020-04-01 LAB — CBC WITH DIFFERENTIAL/PLATELET
Absolute Monocytes: 915 cells/uL (ref 200–950)
Basophils Absolute: 51 cells/uL (ref 0–200)
Basophils Relative: 0.8 %
Eosinophils Absolute: 160 cells/uL (ref 15–500)
Eosinophils Relative: 2.5 %
HCT: 45.6 % (ref 38.5–50.0)
Hemoglobin: 15.8 g/dL (ref 13.2–17.1)
Lymphs Abs: 2029 cells/uL (ref 850–3900)
MCH: 30.9 pg (ref 27.0–33.0)
MCHC: 34.6 g/dL (ref 32.0–36.0)
MCV: 89.1 fL (ref 80.0–100.0)
MPV: 11.6 fL (ref 7.5–12.5)
Monocytes Relative: 14.3 %
Neutro Abs: 3245 cells/uL (ref 1500–7800)
Neutrophils Relative %: 50.7 %
Platelets: 235 10*3/uL (ref 140–400)
RBC: 5.12 10*6/uL (ref 4.20–5.80)
RDW: 13.1 % (ref 11.0–15.0)
Total Lymphocyte: 31.7 %
WBC: 6.4 10*3/uL (ref 3.8–10.8)

## 2020-04-01 LAB — PSA: PSA: 0.48 ng/mL (ref <=4.0)

## 2020-04-01 LAB — HEPATIC FUNCTION PANEL
AG Ratio: 1.7 (calc) (ref 1.0–2.5)
ALT: 30 U/L (ref 9–46)
AST: 23 U/L (ref 10–35)
Albumin: 4.5 g/dL (ref 3.6–5.1)
Alkaline phosphatase (APISO): 54 U/L (ref 35–144)
Bilirubin, Direct: 0.2 mg/dL (ref 0.0–0.2)
Globulin: 2.7 g/dL (calc) (ref 1.9–3.7)
Indirect Bilirubin: 1.2 mg/dL (calc) (ref 0.2–1.2)
Total Bilirubin: 1.4 mg/dL — ABNORMAL HIGH (ref 0.2–1.2)
Total Protein: 7.2 g/dL (ref 6.1–8.1)

## 2020-04-01 LAB — BASIC METABOLIC PANEL WITH GFR
BUN: 17 mg/dL (ref 7–25)
CO2: 28 mmol/L (ref 20–32)
Calcium: 10 mg/dL (ref 8.6–10.3)
Chloride: 100 mmol/L (ref 98–110)
Creat: 0.76 mg/dL (ref 0.70–1.18)
GFR, Est African American: 103 mL/min/{1.73_m2} (ref >=60)
GFR, Est Non African American: 89 mL/min/{1.73_m2} (ref >=60)
Glucose, Bld: 117 mg/dL — ABNORMAL HIGH (ref 65–99)
Potassium: 4.1 mmol/L (ref 3.5–5.3)
Sodium: 139 mmol/L (ref 135–146)

## 2020-04-01 LAB — LIPID PANEL
Cholesterol: 151 mg/dL (ref <200)
HDL: 36 mg/dL — ABNORMAL LOW (ref >=40)
LDL Cholesterol (Calc): 90 mg/dL (calc)
Non-HDL Cholesterol (Calc): 115 mg/dL (calc) (ref <130)
Total CHOL/HDL Ratio: 4.2 (calc) (ref <5.0)
Triglycerides: 155 mg/dL — ABNORMAL HIGH (ref <150)

## 2020-04-01 LAB — HEMOGLOBIN A1C
Hgb A1c MFr Bld: 6.5 % of total Hgb — ABNORMAL HIGH (ref <5.7)
Mean Plasma Glucose: 140 (calc)
eAG (mmol/L): 7.7 (calc)

## 2020-04-01 LAB — TSH: TSH: 1.56 mIU/L (ref 0.40–4.50)

## 2020-04-04 ENCOUNTER — Encounter: Payer: Self-pay | Admitting: Family Medicine

## 2020-04-04 ENCOUNTER — Ambulatory Visit (INDEPENDENT_AMBULATORY_CARE_PROVIDER_SITE_OTHER): Payer: Medicare Other | Admitting: Family Medicine

## 2020-04-04 ENCOUNTER — Other Ambulatory Visit: Payer: Self-pay

## 2020-04-04 VITALS — BP 166/82 | HR 63 | Temp 98.1°F | Ht 67.01 in | Wt 181.0 lb

## 2020-04-04 DIAGNOSIS — L03011 Cellulitis of right finger: Secondary | ICD-10-CM | POA: Diagnosis not present

## 2020-04-04 MED ORDER — CEPHALEXIN 500 MG PO CAPS: 500.0000 mg | ORAL_CAPSULE | Freq: Three times a day (TID) | ORAL | 0 refills | Status: AC

## 2020-04-04 NOTE — Progress Notes (Signed)
   Subjective:    Patient ID: [name], male    DOB: Oct 07, 1945, 74 y.o.   MRN: [id number]  HPI Here for redness and mild soreness of his right 3rd finger. This started 4 days ago after he worked in his garden pulling up plants. He says it started with a small split in the skin, which is now closed. Initially he cleaned it with peroxide.    Review of Systems  Constitutional: Negative.   Skin: Positive for color change.       Objective:   Physical Exam Constitutional:      Appearance: Normal appearance.  Cardiovascular:     Rate and Rhythm: Normal rate and regular rhythm.     Pulses: Normal pulses.     Heart sounds: Normal heart sounds.  Pulmonary:     Effort: Pulmonary effort is normal.     Breath sounds: Normal breath sounds.  Skin:    Comments: Right 3rd finger has areas of redness and slight tenderness over the distal 1/3. No warmth or raised lesions   Neurological:     Mental Status: He is alert.           Assessment & Plan:  Early cellulitis, treat with Keflex for 10 days. Recheck as needed.  Gershon Crane, MD

## 2020-05-23 ENCOUNTER — Telehealth: Payer: Self-pay | Admitting: Family Medicine

## 2020-05-23 NOTE — Telephone Encounter (Signed)
Spoke to patient to schedule Medicare Annual Wellness Visit (AWV) either virtually or in office.  He stated he would call back to schedule.  He has a lot of other appointments he wants to take care of 1st   Last AWV ; please schedule at anytime with [facility] Nurse Health Advisor 1 or 2   This should be a 45 minute visit.

## 2020-07-01 ENCOUNTER — Other Ambulatory Visit: Payer: Self-pay | Admitting: Family Medicine

## 2020-07-10 ENCOUNTER — Encounter: Payer: Self-pay | Admitting: Family Medicine

## 2020-07-10 NOTE — Telephone Encounter (Signed)
They usually reach out to [place] with a fax about this. I will be glad to help once I get this

## 2020-07-11 ENCOUNTER — Telehealth: Payer: Self-pay

## 2020-07-11 NOTE — Telephone Encounter (Signed)
Pre-op form filled out/signed/faxed to [facility] @ [id number]. Dm/cma

## 2020-07-12 NOTE — Telephone Encounter (Signed)
I filled out the fax yesterday, but there was nothing about lab work on it. Usually the surgeon orders this directly

## 2020-07-16 ENCOUNTER — Encounter: Payer: Self-pay | Admitting: Family Medicine

## 2020-07-17 NOTE — Telephone Encounter (Signed)
I am not sure what he means by this. Is he having problems with possible aspiration? If so, what are his symptoms?

## 2020-07-17 NOTE — Telephone Encounter (Signed)
This message was just an FYI on how shots should be given.  Dm/cma

## 2020-07-19 ENCOUNTER — Encounter: Payer: Self-pay | Admitting: Family Medicine

## 2020-07-20 NOTE — Telephone Encounter (Signed)
Noted. We will scan these into his chart.

## 2020-09-21 ENCOUNTER — Encounter: Payer: Self-pay | Admitting: Family Medicine

## 2020-09-21 NOTE — Telephone Encounter (Signed)
Please take care of this.  

## 2020-09-22 NOTE — Telephone Encounter (Signed)
This is already documented in pt chart, left pt a message to send the dates of the first dose since per pt message on 09/21/20 state that he has completed 2 dose series

## 2021-01-08 ENCOUNTER — Other Ambulatory Visit: Payer: Self-pay | Admitting: Family Medicine

## 2021-01-08 NOTE — Telephone Encounter (Signed)
Pt  Needs appointment for further visits

## 2021-01-09 NOTE — Telephone Encounter (Signed)
Pt needs appointment for further refills 

## 2021-02-07 ENCOUNTER — Telehealth: Payer: Self-pay | Admitting: Family Medicine

## 2021-02-07 NOTE — Telephone Encounter (Signed)
Left message for patient to call back and schedule Medicare Annual Wellness Visit (AWV) either virtually or in office. Left  my Zachery Conch number [phone]   AWV-I/per  PALMETTO 06/27/10  please schedule at anytime with [facility] Nurse Health Advisor 1 or 2   This should be a 45 minute visit.

## 2021-03-27 ENCOUNTER — Ambulatory Visit (INDEPENDENT_AMBULATORY_CARE_PROVIDER_SITE_OTHER): Payer: Medicare Other | Admitting: *Deleted

## 2021-03-27 ENCOUNTER — Other Ambulatory Visit: Payer: Self-pay

## 2021-03-27 DIAGNOSIS — Z23 Encounter for immunization: Secondary | ICD-10-CM | POA: Diagnosis not present

## 2021-04-08 ENCOUNTER — Other Ambulatory Visit: Payer: Self-pay | Admitting: Family Medicine

## 2021-04-09 NOTE — Telephone Encounter (Signed)
Pt needs appointment for further refills 

## 2021-04-17 ENCOUNTER — Encounter: Payer: Self-pay | Admitting: Family Medicine

## 2021-04-23 NOTE — Telephone Encounter (Signed)
(  1) tell him that I do not do "Medicare wellness visits" but we have a nurse that does this. I plan to still do a complete exam with lab work every year like I always have except I do not code it that way. This allows Medicare to cover it. (2) we will discuss the colonoscopy question at his visit. (3) I suggest his wife see [facility]

## 2021-05-22 ENCOUNTER — Ambulatory Visit (INDEPENDENT_AMBULATORY_CARE_PROVIDER_SITE_OTHER): Payer: Medicare Other

## 2021-05-22 VITALS — Ht 68.0 in | Wt 174.0 lb

## 2021-05-22 DIAGNOSIS — Z0000 Encounter for general adult medical examination without abnormal findings: Secondary | ICD-10-CM

## 2021-05-22 NOTE — Progress Notes (Signed)
I connected with [name] today by telephone and verified that I am speaking with the correct person using two identifiers. Location patient: home Location provider: work Persons participating in the virtual visit: Dariush, Zhang LPN.   I discussed the limitations, risks, security and privacy concerns of performing an evaluation and management service by telephone and the availability of in person appointments. I also discussed with the patient that there may be a patient responsible charge related to this service. The patient expressed understanding and verbally consented to this telephonic visit.    Interactive audio and video telecommunications were attempted between this provider and patient, however failed, due to patient having technical difficulties OR patient did not have access to video capability.  We continued and completed visit with audio only.     Vital signs may be patient reported or missing.  Subjective:   [name] is a 75 y.o. male who presents for an Initial Medicare Annual Wellness Visit.  Review of Systems     Cardiac Risk Factors include: advanced age (>12men, >14 women);dyslipidemia;hypertension;male gender     Objective:    Today's Vitals   05/22/21 1211  Weight: 174 lb (78.9 kg)  Height: 5\' 8"  (1.727 m)   Body mass index is 26.46 kg/m.  Advanced Directives 05/22/2021  Does Patient Have a Medical Advance Directive? Yes  Type of Paramedic of [place];Living will  Copy of [place] in Chart? No - copy requested    Current Medications (verified) Outpatient Encounter Medications as of 05/22/2021  Medication Sig   amLODipine (NORVASC) 5 MG tablet TAKE 1 TABLET DAILY   ascorbic acid (VITAMIN C) 500 MG tablet Take by mouth.   aspirin 81 MG tablet Take 81 mg by mouth daily.     atorvastatin (LIPITOR) 20 MG tablet TAKE 1 TABLET DAILY   Cholecalciferol 25 MCG (1000 UT) capsule Take by mouth.    fish oil-omega-3 fatty acids 1000 MG capsule Take 2 g by mouth daily.     metFORMIN (GLUCOPHAGE) 500 MG tablet TAKE 1 TABLET TWICE DAILY  WITH MEALS   valsartan-hydrochlorothiazide (DIOVAN-HCT) 320-25 MG tablet TAKE 1 TABLET DAILY   zinc gluconate 50 MG tablet Take by mouth.   No facility-administered encounter medications on file as of 05/22/2021.    Allergies (verified) Patient has no known allergies.   History: Past Medical History:  Diagnosis Date   Hyperlipidemia    Hypertension    Skin lesion    sees Dr. Crista Luria    Past Surgical History:  Procedure Laterality Date   colonoscopy  12-28-09   per Dr. Olevia Perches, diverticulosis, repeat in 10 yrs    SKIN CANCER EXCISION     rt cheek   History reviewed. No pertinent family history. Social History   Socioeconomic History   Marital status: Married    Spouse name: Not on file   Number of children: Not on file   Years of education: Not on file   Highest education level: Not on file  Occupational History   Not on file  Tobacco Use   Smoking status: Never   Smokeless tobacco: Never  Vaping Use   Vaping Use: Never used  Substance and Sexual Activity   Alcohol use: Yes    Alcohol/week: 0.0 standard drinks    Comment: couple times a month   Drug use: No   Sexual activity: Not on file  Other Topics Concern   Not on file  Social History  Narrative   Not on file   Social Determinants of Health   Financial Resource Strain: Low Risk    Difficulty of Paying Living Expenses: Not hard at all  Food Insecurity: No Food Insecurity   Worried About Charity fundraiser in the Last Year: Never true   [place] in the Last Year: Never true  Transportation Needs: No Transportation Needs   Lack of Transportation (Medical): No   Lack of Transportation (Non-Medical): No  Physical Activity: Sufficiently Active   Days of Exercise per Week: 4 days   Minutes of Exercise per Session: 90 min  Stress: No Stress Concern Present    Feeling of Stress : Not at all  Social Connections: Not on file    Tobacco Counseling Counseling given: Not Answered   Clinical Intake:  Pre-visit preparation completed: Yes  Pain : No/denies pain     Nutritional Status: BMI 25 -29 Overweight Nutritional Risks: None Diabetes: No  How often do you need to have someone help you when you read instructions, pamphlets, or other written materials from your doctor or pharmacy?: 1 - Never What is the last grade level you completed in school?: BS degree  Diabetic? no  Interpreter Needed?: No  Information entered by :: NAllen LPN   Activities of Daily Living In your present state of health, do you have any difficulty performing the following activities: 05/22/2021 05/21/2021  Hearing? N N  Vision? N N  Difficulty concentrating or making decisions? N N  Walking or climbing stairs? N N  Dressing or bathing? N N  Doing errands, shopping? N N  Preparing Food and eating ? N N  Using the Toilet? N N  In the past six months, have you accidently leaked urine? N N  Do you have problems with loss of bowel control? N N  Managing your Medications? N N  Managing your Finances? N N  Housekeeping or managing your Housekeeping? N N  Some recent data might be hidden    Patient Care Team: Laurey Morale, MD as PCP - General  Indicate any recent Medical Services you may have received from other than [facility] providers in the past year (date may be approximate).     Assessment:   This is a routine wellness examination for [name].  Hearing/Vision screen Vision Screening - Comments:: Regular eye exams,  Dietary issues and exercise activities discussed: Current Exercise Habits: Home exercise routine, Type of exercise: strength training/weights;calisthenics, Time (Minutes): > 60, Frequency (Times/Week): 4, Weekly Exercise (Minutes/Week): 0   Goals Addressed             This Visit's Progress    Patient Stated       05/22/2021, keep  living       Depression Screen PHQ 2/9 Scores 05/22/2021 04/04/2020 03/31/2020 09/02/2017 12/19/2015 10/04/2014  PHQ - 2 Score 0 0 0 0 0 0  PHQ- 9 Score - - 0 - - -    Fall Risk Fall Risk  05/22/2021 05/21/2021 04/04/2020 03/31/2020 09/02/2017  Falls in the past year? 0 0 0 - No  Number falls in past yr: - 0 0 0 -  Injury with Fall? - 0 0 0 -  Risk for fall due to : Medication side effect - - - -  Follow up Falls evaluation completed;Education provided;Falls prevention discussed - Falls evaluation completed - -    FALL RISK PREVENTION PERTAINING TO THE HOME:  Any stairs in or around the home? Yes  If so, are there any without handrails? No  Home free of loose throw rugs in walkways, pet beds, electrical cords, etc? Yes  Adequate lighting in your home to reduce risk of falls? Yes   ASSISTIVE DEVICES UTILIZED TO PREVENT FALLS:  Life alert? No  Use of a cane, walker or w/c? No  Grab bars in the bathroom? Yes  Shower chair or bench in shower? Yes  Elevated toilet seat or a handicapped toilet? No   TIMED UP AND GO:  Was the test performed? No .      Cognitive Function:     6CIT Screen 05/22/2021  What Year? 0 points  What month? 0 points  What time? 0 points  Count back from 20 0 points  Months in reverse 0 points  Repeat phrase 2 points  Total Score 2    Immunizations Immunization History  Administered Date(s) Administered   Fluad Quad(high Dose 65+) 03/12/2019, 03/27/2021   Influenza, High Dose Seasonal PF 04/13/2015, 04/05/2016, 03/21/2017, 02/10/2018   PFIZER(Purple Top)SARS-COV-2 Vaccination 06/23/2019, 07/14/2019   Pneumococcal Conjugate-13 11/25/2014   Pneumococcal Polysaccharide-23 07/24/2007   Td 07/24/2007   Tdap 09/02/2017   Zoster Recombinat (Shingrix) 06/23/2020, 09/19/2020   Zoster, Live 07/19/2009    TDAP status: Up to date  Flu Vaccine status: Up to date  Pneumococcal vaccine status: Up to date  Covid-19 vaccine status: Completed  vaccines  Qualifies for Shingles Vaccine? Yes   Zostavax completed Yes   Shingrix Completed?: Yes  Screening Tests Health Maintenance  Topic Date Due   Hepatitis C Screening  Never done   COVID-19 Vaccine (3 - Pfizer risk series) 06/07/2021 (Originally 08/11/2019)   Pneumonia Vaccine 96+ Years old (3 - PPSV23 if available, else PCV20) 05/22/2022 (Originally 11/25/2015)   TETANUS/TDAP  09/03/2027   INFLUENZA VACCINE  Completed   Zoster Vaccines- Shingrix  Completed   HPV VACCINES  Aged Out   COLONOSCOPY (Pts 45-34yrs Insurance coverage will need to be confirmed)  Discontinued    Health Maintenance  Health Maintenance Due  Topic Date Due   Hepatitis C Screening  Never done    Colorectal cancer screening: No longer required.   Lung Cancer Screening: (Low Dose CT Chest recommended if Age 78-80 years, 30 pack-year currently smoking OR have quit w/in 15years.) does not qualify.   Lung Cancer Screening Referral: no  Additional Screening:  Hepatitis C Screening: does qualify;   Vision Screening: Recommended annual ophthalmology exams for early detection of glaucoma and other disorders of the eye. Is the patient up to date with their annual eye exam?  Yes  Who is the provider or what is the name of the office in which the patient attends annual eye exams? Dr. Susa Simmonds If pt is not established with a provider, would they like to be referred to a provider to establish care? No .   Dental Screening: Recommended annual dental exams for proper oral hygiene  Community Resource Referral / Chronic Care Management: CRR required this visit?  No   CCM required this visit?  No      Plan:     I have personally reviewed and noted the following in the patients chart:   Medical and social history Use of alcohol, tobacco or illicit drugs  Current medications and supplements including opioid prescriptions. Patient is not currently taking opioid prescriptions. Functional ability and  status Nutritional status Physical activity Advanced directives List of other physicians Hospitalizations, surgeries, and ER visits in previous 12 months Vitals Screenings to  include cognitive, depression, and falls Referrals and appointments  In addition, I have reviewed and discussed with patient certain preventive protocols, quality metrics, and best practice recommendations. A written personalized care plan for preventive services as well as general preventive health recommendations were provided to patient.     Barb Merino, LPN   [id number]   Nurse Notes: none

## 2021-05-22 NOTE — Patient Instructions (Signed)
Mr. [name] , Thank you for taking time to come for your Medicare Wellness Visit. I appreciate your ongoing commitment to your health goals. Please review the following plan we discussed and let me know if I can assist you in the future.   Screening recommendations/referrals: Colonoscopy: not required Recommended yearly ophthalmology/optometry visit for glaucoma screening and checkup Recommended yearly dental visit for hygiene and checkup  Vaccinations: Influenza vaccine: completed 03/27/2021 Pneumococcal vaccine: completed 11/25/2014 Tdap vaccine: completed 09/02/2017, due 09/03/2027 Shingles vaccine: completed   Covid-19:  07/14/2019, 06/23/2019  Advanced directives: Please bring a copy of your POA (Power of Attorney) and/or Living Will to your next appointment.    Conditions/risks identified: none  Next appointment: Follow up in one year for your annual wellness visit.   Preventive Care 7 Years and Older, Male Preventive care refers to lifestyle choices and visits with your health care provider that can promote health and wellness. What does preventive care include? A yearly physical exam. This is also called an annual well check. Dental exams once or twice a year. Routine eye exams. Ask your health care provider how often you should have your eyes checked. Personal lifestyle choices, including: Daily care of your teeth and gums. Regular physical activity. Eating a healthy diet. Avoiding tobacco and drug use. Limiting alcohol use. Practicing safe sex. Taking low doses of aspirin every day. Taking vitamin and mineral supplements as recommended by your health care provider. What happens during an annual well check? The services and screenings done by your health care provider during your annual well check will depend on your age, overall health, lifestyle risk factors, and family history of disease. Counseling  Your health care provider may ask you questions about your: Alcohol  use. Tobacco use. Drug use. Emotional well-being. Home and relationship well-being. Sexual activity. Eating habits. History of falls. Memory and ability to understand (cognition). Work and work Astronomer. Screening  You may have the following tests or measurements: Height, weight, and BMI. Blood pressure. Lipid and cholesterol levels. These may be checked every 5 years, or more frequently if you are over 23 years old. Skin check. Lung cancer screening. You may have this screening every year starting at age 33 if you have a 30-pack-year history of smoking and currently smoke or have quit within the past 15 years. Fecal occult blood test (FOBT) of the stool. You may have this test every year starting at age 11. Flexible sigmoidoscopy or colonoscopy. You may have a sigmoidoscopy every 5 years or a colonoscopy every 10 years starting at age 6. Prostate cancer screening. Recommendations will vary depending on your family history and other risks. Hepatitis C blood test. Hepatitis B blood test. Sexually transmitted disease (STD) testing. Diabetes screening. This is done by checking your blood sugar (glucose) after you have not eaten for a while (fasting). You may have this done every 1-3 years. Abdominal aortic aneurysm (AAA) screening. You may need this if you are a current or former smoker. Osteoporosis. You may be screened starting at age 54 if you are at high risk. Talk with your health care provider about your test results, treatment options, and if necessary, the need for more tests. Vaccines  Your health care provider may recommend certain vaccines, such as: Influenza vaccine. This is recommended every year. Tetanus, diphtheria, and acellular pertussis (Tdap, Td) vaccine. You may need a Td booster every 10 years. Zoster vaccine. You may need this after age 25. Pneumococcal 13-valent conjugate (PCV13) vaccine. One dose is recommended  after age 23. Pneumococcal polysaccharide  (PPSV23) vaccine. One dose is recommended after age 69. Talk to your health care provider about which screenings and vaccines you need and how often you need them. This information is not intended to replace advice given to you by your health care provider. Make sure you discuss any questions you have with your health care provider. Document Released: 06/09/2015 Document Revised: 01/31/2016 Document Reviewed: 03/14/2015 Elsevier Interactive Patient Education  2017 [employer] Prevention in the Home Falls can cause injuries. They can happen to people of all ages. There are many things you can do to make your home safe and to help prevent falls. What can I do on the outside of my home? Regularly fix the edges of walkways and driveways and fix any cracks. Remove anything that might make you trip as you walk through a door, such as a raised step or threshold. Trim any bushes or trees on the path to your home. Use bright outdoor lighting. Clear any walking paths of anything that might make someone trip, such as rocks or tools. Regularly check to see if handrails are loose or broken. Make sure that both sides of any steps have handrails. Any raised decks and porches should have guardrails on the edges. Have any leaves, snow, or ice cleared regularly. Use sand or salt on walking paths during winter. Clean up any spills in your garage right away. This includes oil or grease spills. What can I do in the bathroom? Use night lights. Install grab bars by the toilet and in the tub and shower. Do not use towel bars as grab bars. Use non-skid mats or decals in the tub or shower. If you need to sit down in the shower, use a plastic, non-slip stool. Keep the floor dry. Clean up any water that spills on the floor as soon as it happens. Remove soap buildup in the tub or shower regularly. Attach bath mats securely with double-sided non-slip rug tape. Do not have throw rugs and other things on the  floor that can make you trip. What can I do in the bedroom? Use night lights. Make sure that you have a light by your bed that is easy to reach. Do not use any sheets or blankets that are too big for your bed. They should not hang down onto the floor. Have a firm chair that has side arms. You can use this for support while you get dressed. Do not have throw rugs and other things on the floor that can make you trip. What can I do in the kitchen? Clean up any spills right away. Avoid walking on wet floors. Keep items that you use a lot in easy-to-reach places. If you need to reach something above you, use a strong step stool that has a grab bar. Keep electrical cords out of the way. Do not use floor polish or wax that makes floors slippery. If you must use wax, use non-skid floor wax. Do not have throw rugs and other things on the floor that can make you trip. What can I do with my stairs? Do not leave any items on the stairs. Make sure that there are handrails on both sides of the stairs and use them. Fix handrails that are broken or loose. Make sure that handrails are as long as the stairways. Check any carpeting to make sure that it is firmly attached to the stairs. Fix any carpet that is loose or worn. Avoid having throw  rugs at the top or bottom of the stairs. If you do have throw rugs, attach them to the floor with carpet tape. Make sure that you have a light switch at the top of the stairs and the bottom of the stairs. If you do not have them, ask someone to add them for you. What else can I do to help prevent falls? Wear shoes that: Do not have high heels. Have rubber bottoms. Are comfortable and fit you well. Are closed at the toe. Do not wear sandals. If you use a stepladder: Make sure that it is fully opened. Do not climb a closed stepladder. Make sure that both sides of the stepladder are locked into place. Ask someone to hold it for you, if possible. Clearly mark and make  sure that you can see: Any grab bars or handrails. First and last steps. Where the edge of each step is. Use tools that help you move around (mobility aids) if they are needed. These include: Canes. Walkers. Scooters. Crutches. Turn on the lights when you go into a dark area. Replace any light bulbs as soon as they burn out. Set up your furniture so you have a clear path. Avoid moving your furniture around. If any of your floors are uneven, fix them. If there are any pets around you, be aware of where they are. Review your medicines with your doctor. Some medicines can make you feel dizzy. This can increase your chance of falling. Ask your doctor what other things that you can do to help prevent falls. This information is not intended to replace advice given to you by your health care provider. Make sure you discuss any questions you have with your health care provider. Document Released: 03/09/2009 Document Revised: 10/19/2015 Document Reviewed: 06/17/2014 Elsevier Interactive Patient Education  2017 [employer].

## 2021-05-31 ENCOUNTER — Ambulatory Visit (INDEPENDENT_AMBULATORY_CARE_PROVIDER_SITE_OTHER): Payer: Medicare Other | Admitting: Family Medicine

## 2021-05-31 ENCOUNTER — Encounter: Payer: Self-pay | Admitting: Family Medicine

## 2021-05-31 VITALS — BP 130/76 | HR 65 | Temp 98.5°F | Ht 68.0 in | Wt 175.1 lb

## 2021-05-31 DIAGNOSIS — N401 Enlarged prostate with lower urinary tract symptoms: Secondary | ICD-10-CM | POA: Diagnosis not present

## 2021-05-31 DIAGNOSIS — I10 Essential (primary) hypertension: Secondary | ICD-10-CM

## 2021-05-31 DIAGNOSIS — R739 Hyperglycemia, unspecified: Secondary | ICD-10-CM | POA: Diagnosis not present

## 2021-05-31 DIAGNOSIS — N138 Other obstructive and reflux uropathy: Secondary | ICD-10-CM

## 2021-05-31 DIAGNOSIS — E782 Mixed hyperlipidemia: Secondary | ICD-10-CM | POA: Diagnosis not present

## 2021-05-31 DIAGNOSIS — Z1211 Encounter for screening for malignant neoplasm of colon: Secondary | ICD-10-CM

## 2021-05-31 LAB — PSA: PSA: 0.48 ng/mL (ref 0.10–4.00)

## 2021-05-31 LAB — CBC WITH DIFFERENTIAL/PLATELET
Basophils Absolute: 0 10*3/uL (ref 0.0–0.1)
Basophils Relative: 0.6 % (ref 0.0–3.0)
Eosinophils Absolute: 0.2 10*3/uL (ref 0.0–0.7)
Eosinophils Relative: 2.8 % (ref 0.0–5.0)
HCT: 43.9 % (ref 39.0–52.0)
Hemoglobin: 14.7 g/dL (ref 13.0–17.0)
Lymphocytes Relative: 33.7 % (ref 12.0–46.0)
Lymphs Abs: 2 10*3/uL (ref 0.7–4.0)
MCHC: 33.6 g/dL (ref 30.0–36.0)
MCV: 91 fl (ref 78.0–100.0)
Monocytes Absolute: 0.7 10*3/uL (ref 0.1–1.0)
Monocytes Relative: 12.2 % — ABNORMAL HIGH (ref 3.0–12.0)
Neutro Abs: 3 10*3/uL (ref 1.4–7.7)
Neutrophils Relative %: 50.7 % (ref 43.0–77.0)
Platelets: 212 10*3/uL (ref 150.0–400.0)
RBC: 4.82 Mil/uL (ref 4.22–5.81)
RDW: 13.9 % (ref 11.5–15.5)
WBC: 6 10*3/uL (ref 4.0–10.5)

## 2021-05-31 LAB — LIPID PANEL
Cholesterol: 153 mg/dL (ref 0–200)
HDL: 39.4 mg/dL (ref >39.00)
LDL Cholesterol: 82 mg/dL (ref 0–99)
NonHDL: 113.11
Total CHOL/HDL Ratio: 4
Triglycerides: 157 mg/dL — ABNORMAL HIGH (ref 0.0–149.0)
VLDL: 31.4 mg/dL (ref 0.0–40.0)

## 2021-05-31 LAB — HEPATIC FUNCTION PANEL
ALT: 31 U/L (ref 0–53)
AST: 23 U/L (ref 0–37)
Albumin: 4.4 g/dL (ref 3.5–5.2)
Alkaline Phosphatase: 46 U/L (ref 39–117)
Bilirubin, Direct: 0.2 mg/dL (ref 0.0–0.3)
Total Bilirubin: 1.3 mg/dL — ABNORMAL HIGH (ref 0.2–1.2)
Total Protein: 6.8 g/dL (ref 6.0–8.3)

## 2021-05-31 LAB — BASIC METABOLIC PANEL
BUN: 20 mg/dL (ref 6–23)
CO2: 31 mEq/L (ref 19–32)
Calcium: 9.8 mg/dL (ref 8.4–10.5)
Chloride: 100 mEq/L (ref 96–112)
Creatinine, Ser: 0.86 mg/dL (ref 0.40–1.50)
GFR: 83.86 mL/min (ref >60.00)
Glucose, Bld: 119 mg/dL — ABNORMAL HIGH (ref 70–99)
Potassium: 4 mEq/L (ref 3.5–5.1)
Sodium: 139 mEq/L (ref 135–145)

## 2021-05-31 LAB — HEMOGLOBIN A1C: Hgb A1c MFr Bld: 6.6 % — ABNORMAL HIGH (ref 4.6–6.5)

## 2021-05-31 LAB — TSH: TSH: 1.55 u[IU]/mL (ref 0.35–5.50)

## 2021-05-31 MED ORDER — VALSARTAN-HYDROCHLOROTHIAZIDE 320-25 MG PO TABS: 1.0000 | ORAL_TABLET | Freq: Every day | ORAL | 3 refills | Status: DC

## 2021-05-31 MED ORDER — AMLODIPINE BESYLATE 5 MG PO TABS: 5.0000 mg | ORAL_TABLET | Freq: Every day | ORAL | 3 refills | Status: DC

## 2021-05-31 MED ORDER — ATORVASTATIN CALCIUM 20 MG PO TABS: 20.0000 mg | ORAL_TABLET | Freq: Every day | ORAL | 3 refills | Status: DC

## 2021-05-31 MED ORDER — METFORMIN HCL 500 MG PO TABS: 500.0000 mg | ORAL_TABLET | Freq: Two times a day (BID) | ORAL | 3 refills | Status: DC

## 2021-05-31 NOTE — Progress Notes (Signed)
Subjective:    Patient ID: [name], male    DOB: 12-22-1945, 76 y.o.   MRN: [id number]  HPI Here to follow up on issues. He feels well. He has lost a little weight by adjusting his diet, and he has more energy. He lifts weights and rides an elliptical bike daily. His BP has been stable at home. He is past due for a colonoscopy.    Review of Systems  Constitutional: Negative.   HENT: Negative.    Eyes: Negative.   Respiratory: Negative.    Cardiovascular: Negative.   Gastrointestinal: Negative.   Genitourinary: Negative.   Musculoskeletal: Negative.   Skin: Negative.   Neurological: Negative.   Psychiatric/Behavioral: Negative.        Objective:   Physical Exam Constitutional:      General: He is not in acute distress.    Appearance: Normal appearance. He is well-developed. He is not diaphoretic.  HENT:     Head: Normocephalic and atraumatic.     Right Ear: External ear normal.     Left Ear: External ear normal.     Nose: Nose normal.     Mouth/Throat:     Pharynx: No oropharyngeal exudate.  Eyes:     General: No scleral icterus.       Right eye: No discharge.        Left eye: No discharge.     Conjunctiva/sclera: Conjunctivae normal.     Pupils: Pupils are equal, round, and reactive to light.  Neck:     Thyroid: No thyromegaly.     Vascular: No JVD.     Trachea: No tracheal deviation.  Cardiovascular:     Rate and Rhythm: Normal rate and regular rhythm.     Heart sounds: Normal heart sounds. No murmur heard.   No friction rub. No gallop.  Pulmonary:     Effort: Pulmonary effort is normal. No respiratory distress.     Breath sounds: Normal breath sounds. No wheezing or rales.  Chest:     Chest wall: No tenderness.  Abdominal:     General: Bowel sounds are normal. There is no distension.     Palpations: Abdomen is soft. There is no mass.     Tenderness: There is no abdominal tenderness. There is no guarding or rebound.  Genitourinary:    Penis: Normal.  No tenderness.      Testes: Normal.     Prostate: Normal.     Rectum: Normal. Guaiac result negative.  Musculoskeletal:        General: No tenderness. Normal range of motion.     Cervical back: Neck supple.  Lymphadenopathy:     Cervical: No cervical adenopathy.  Skin:    General: Skin is warm and dry.     Coloration: Skin is not pale.     Findings: No erythema or rash.  Neurological:     Mental Status: He is alert and oriented to person, place, and time.     Cranial Nerves: No cranial nerve deficit.     Motor: No abnormal muscle tone.     Coordination: Coordination normal.     Deep Tendon Reflexes: Reflexes are normal and symmetric. Reflexes normal.  Psychiatric:        Behavior: Behavior normal.        Thought Content: Thought content normal.        Judgment: Judgment normal.          Assessment & Plan:  His HTN and BPH  are stable. We will get fasting labs to check lipids, an A1c, etc. Set up another colonoscopy. We spent a total of ( 30  ) minutes reviewing records and discussing these issues.  Gershon Crane, MD

## 2021-06-12 ENCOUNTER — Encounter: Payer: Self-pay | Admitting: Family Medicine

## 2022-02-08 ENCOUNTER — Telehealth: Payer: Self-pay | Admitting: *Deleted

## 2022-02-27 ENCOUNTER — Ambulatory Visit (INDEPENDENT_AMBULATORY_CARE_PROVIDER_SITE_OTHER): Payer: Medicare Other

## 2022-02-27 DIAGNOSIS — Z23 Encounter for immunization: Secondary | ICD-10-CM

## 2022-06-04 ENCOUNTER — Encounter: Payer: Self-pay | Admitting: Family Medicine

## 2022-06-04 ENCOUNTER — Telehealth (INDEPENDENT_AMBULATORY_CARE_PROVIDER_SITE_OTHER): Payer: Medicare Other | Admitting: Family Medicine

## 2022-06-04 VITALS — BP 131/62 | HR 61 | Ht 68.0 in | Wt 176.0 lb

## 2022-06-04 DIAGNOSIS — Z0000 Encounter for general adult medical examination without abnormal findings: Secondary | ICD-10-CM | POA: Diagnosis not present

## 2022-06-04 NOTE — Progress Notes (Signed)
PATIENT CHECK-IN and HEALTH RISK ASSESSMENT QUESTIONNAIRE:  -completed by phone/video for upcoming Medicare Preventive Visit  Pre-Visit Check-in: 1)Vitals (height, wt, BP, etc) - record in vitals section for visit on day of visit 2)Review and Update Medications, Allergies PMH, Surgeries, Social history in Epic 3)Hospitalizations in the last year with date/reason? N/A  4)Review and Update Care Team (patient's specialists) in Epic 5) Complete PHQ9 in Epic  6) Complete Fall Screening in Epic 7)Review all Health Maintenance Due and order under PCP if not done.  Medicare Wellness Patient Questionnaire:  Answer theses question about your habits: Do you drink alcohol? yes If yes, how many drinks do you have a day?1 glass of wine every now and then Have you ever smoked?never Quit date if applicable? N/a  How many packs a day do/did you smoke? N/a Do you use smokeless tobacco?n/a Do you use an illicit drugs?n/a Do you exercises? yesIF so, what type and how many days/minutes per week? 7 days 2 hours - does cardio, strength building and balance exercises.  Are you sexually active? Yes Number of partners?1 wife Typical breakfast N/a Typical lunch sandwich and fruit Typical dinner protein, veggies, and a carb Typical snacks: chips and nuts  Beverages: coffee, water and buttermilk  Answer theses question about you: Can you perform most household chores?yes Do you find it hard to follow a conversation in a noisy room?  no Do you often ask people to speak up or repeat themselves?no Do you feel that you have a problem with memory?no Do you balance your checkbook and or bank acounts?yes Do you feel safe at home?yes Last dentist visit?November with Dr Jaynie Crumble Do you need assistance with any of the following: Please note if so n/a  Driving?  Feeding yourself?  Getting from bed to chair?  Getting to the toilet?  Bathing or showering?  Dressing yourself?  Managing money?  Climbing a flight of  stairs  Preparing meals?    Do you have Advanced Directives in place (Living Will, Healthcare Power or Attorney)? yes   Last eye Exam and location? Last summer with [facility]   Do you currently use prescribed or non-prescribed narcotic or opioid pain medications?no  Do you have a history or close family history of breast, ovarian, tubal or peritoneal cancer or a family member with BRCA (breast cancer susceptibility 1 and 2) gene mutations?  Nurse/Assistant Credentials/time stamp:  Rocky [place], CMA   ----------------------------------------------------------------------------------------------------------------------------------------------------------------------------------------------------------------------    MEDICARE ANNUAL PREVENTIVE CARE VISIT WITH PROVIDER (Welcome to Medicare, initial annual wellness or annual wellness exam)  Virtual Visit via phone Note  I connected with [place]  on 06/04/22 by phone and verified that I am speaking with the correct person using two identifiers.  Location patient: home Location provider:work or home office Persons participating in the virtual visit: patient, provider  Concerns and/or follow up today: none   See HM section in Epic for other details of completed HM.    ROS: negative for report of fevers, unintentional weight loss, vision changes, vision loss, hearing loss or change, chest pain, sob, hemoptysis, melena, hematochezia, hematuria, genital discharge or lesions, falls, bleeding or bruising, loc, thoughts of suicide or self harm, memory loss  Patient-completed extensive health risk assessment - reviewed and discussed with the patient: See Health Risk Assessment completed with patient prior to the visit either above or in recent phone note. This was reviewed in detailed with the patient today and appropriate recommendations, orders and referrals were placed as needed per Summary below  and patient  instructions.   Review of Medical History: -PMH, PSH, Family History and current specialty and care providers reviewed and updated and listed below   Patient Care Team: Laurey Morale, MD as PCP - General   Past Medical History:  Diagnosis Date   Hyperlipidemia    Hypertension    Skin lesion    sees Dr. Crista Luria     Past Surgical History:  Procedure Laterality Date   colonoscopy  12-28-09   per Dr. Olevia Perches, diverticulosis, repeat in 10 yrs    SKIN CANCER EXCISION     rt cheek    Social History   Socioeconomic History   Marital status: Married    Spouse name: Not on file   Number of children: Not on file   Years of education: Not on file   Highest education level: Not on file  Occupational History   Not on file  Tobacco Use   Smoking status: Never   Smokeless tobacco: Never  Vaping Use   Vaping Use: Never used  Substance and Sexual Activity   Alcohol use: Yes    Alcohol/week: 0.0 standard drinks of alcohol    Comment: couple times a month   Drug use: No   Sexual activity: Not on file  Other Topics Concern   Not on file  Social History Narrative   Not on file   Social Determinants of Health   Financial Resource Strain: Low Risk  (06/04/2022)   Overall Financial Resource Strain (CARDIA)    Difficulty of Paying Living Expenses: Not hard at all  Food Insecurity: No Food Insecurity (06/04/2022)   Hunger Vital Sign    Worried About Running Out of Food in the Last Year: Never true    Ran Out of Food in the Last Year: Never true  Transportation Needs: No Transportation Needs (06/04/2022)   PRAPARE - Hydrologist (Medical): No    Lack of Transportation (Non-Medical): No  Physical Activity: Sufficiently Active (06/04/2022)   Exercise Vital Sign    Days of Exercise per Week: 7 days    Minutes of Exercise per Session: 90 min  Stress: No Stress Concern Present (06/04/2022)   [place]    Feeling of Stress : Not at all  Social Connections: Not on file  Intimate Partner Violence: Not At Risk (06/04/2022)   Humiliation, Afraid, Rape, and Kick questionnaire    Fear of Current or Ex-Partner: No    Emotionally Abused: No    Physically Abused: No    Sexually Abused: No    History reviewed. No pertinent family history.  Current Outpatient Medications on File Prior to Visit  Medication Sig Dispense Refill   amLODipine (NORVASC) 5 MG tablet Take 1 tablet (5 mg total) by mouth daily. 90 tablet 3   ascorbic acid (VITAMIN C) 500 MG tablet Take by mouth.     aspirin 81 MG tablet Take 81 mg by mouth daily.       atorvastatin (LIPITOR) 20 MG tablet Take 1 tablet (20 mg total) by mouth daily. 90 tablet 3   Cholecalciferol 25 MCG (1000 UT) capsule Take by mouth.     fish oil-omega-3 fatty acids 1000 MG capsule Take 2 g by mouth daily.       metFORMIN (GLUCOPHAGE) 500 MG tablet Take 1 tablet (500 mg total) by mouth 2 (two) times daily with a meal. 180 tablet 3   valsartan-hydrochlorothiazide (DIOVAN-HCT)  320-25 MG tablet Take 1 tablet by mouth daily. 90 tablet 3   zinc gluconate 50 MG tablet Take by mouth.     No current facility-administered medications on file prior to visit.    No Known Allergies     Physical Exam Vitals:   06/04/22 1010  BP: 131/62  Pulse: 61   Estimated body mass index is 26.76 kg/m as calculated from the following:   Height as of this encounter: 5\' 8"  (1.727 m).   Weight as of this encounter: 176 lb (79.8 kg).  EKG (optional): deferred due to virtual visit  GENERAL: alert, oriented, no audible sounds of distress, vision exam deferred due to audio visit  PSYCH/NEURO: pleasant and cooperative, no obvious depression or anxiety, speech and thought processing grossly intact, Cognitive function grossly intact  [facility] Office Visit from 05/31/2021 in [facility] [place]  PHQ-9 Total Score 0           06/04/2022    10:12 AM 05/31/2021    9:18 AM 05/22/2021   12:16 PM 04/04/2020    8:24 AM 03/31/2020   10:47 AM  Depression screen PHQ 2/9  Decreased Interest 0 0 0 0 0  Down, Depressed, Hopeless 0 0 0 0 0  PHQ - 2 Score 0 0 0 0 0  Altered sleeping  0   0  Tired, decreased energy  0   0  Change in appetite  0   0  Feeling bad or failure about yourself   0   0  Trouble concentrating  0   0  Moving slowly or fidgety/restless  0   0  Suicidal thoughts  0   0  PHQ-9 Score  0   0       05/21/2021   12:54 PM 05/22/2021   12:15 PM 05/31/2021    9:17 AM 05/22/2022    9:37 AM 06/04/2022   10:11 AM  Fall Risk  Falls in the past year? 0 0 0 0 0  Was there an injury with Fall? 0  0 0 0  Fall Risk Category Calculator 0  0 0   0 0  Fall Risk Category Low  Low Low   Low Low  Patient Fall Risk Level  Low fall risk Low fall risk    Patient at Risk for Falls Due to  Medication side effect No Fall Risks    Fall risk Follow up  Falls evaluation completed;Education provided;Falls prevention discussed        SUMMARY AND PLAN:  Medicare annual wellness visit, subsequent   Discussed applicable health maintenance/preventive health measures and advised and referred or ordered per patient preferences:  Health Maintenance  Topic Date Due   Hepatitis C Screening  Never done   COVID-19 Vaccine (3 - Pfizer risk series) 06/20/2022 (Originally 08/11/2019)   Pneumonia Vaccine 63+ Years old (3 - PPSV23 or PCV20) 12/03/2022 (Originally 11/25/2019)   Medicare Annual Wellness (AWV)  06/05/2023   DTaP/Tdap/Td (3 - Td or Tdap) 09/03/2027   INFLUENZA VACCINE  Completed   Zoster Vaccines- Shingrix  Completed   HPV VACCINES  Aged Out   COLONOSCOPY (Pts 45-30yrs Insurance coverage will need to be confirmed)  Discontinued  He plans to consider Hep C screening when does labs with PCP. Discussed vaccines and answered questions. Can do with pharmacy/ office.   Education and counseling on the following was provided based on the  above review of health and a plan/checklist for the patient, along with  additional information discussed, was provided for the patient in the patient instructions :   -Congratulated on regular exercise/hitting all goals for exercise guidelines currently -They prepare meals at home and diet if fairly healthy - advised on servings of fruits/veggies and included details of healthy diet in handout.  -Advise yearly dental visits at minimum and regular eye exams -Advised and counseled on alcohol safe limits/risks.   Follow up: see patient instructions   Patient Instructions  I really enjoyed getting to talk with you today! I am available on Tuesdays and Thursdays for virtual visits if you have any questions or concerns, or if I can be of any further assistance.   CHECKLIST FROM ANNUAL WELLNESS VISIT:  -Follow up (please call to schedule if not scheduled after visit):  -Inperson visit with your Primary Doctor office: once year for physical and labs -yearly for annual wellness visit with primary care office  Here is a list of your preventive care/health maintenance measures and the plan for each if any are due:  Health Maintenance  Topic Date Due   Hepatitis C Screening  Never done, can do when you get labs if you request   COVID-19 Vaccine (3 - Pfizer risk series) Due annually for now   Pneumonia Vaccine 23+ Years old (3 - PPSV23 or PCV20) You have had 2 doses of pneumonia vaccine in the past. The Prevnar 58 is now available if over 62 years old and your other pneumonia vaccines were > 5 years ago. Can get this at the pharmacy if you wish.    Medicare Annual Wellness (AWV)  06/05/2023   DTaP/Tdap/Td (3 - Td or Tdap) 09/03/2027   INFLUENZA VACCINE  Completed   Zoster Vaccines- Shingrix  Completed   HPV VACCINES  Aged Out   COLONOSCOPY (Pts 45-74yrs Insurance coverage will need to be confirmed)  Discontinued    -See a dentist at least yearly  -Get your eyes checked and then per your eye  specialist's recommendations  -Other issues addressed today: -keep up the exercise and healthy eating!  -I have included below further information regarding a healthy whole foods based diet, physical activity guidelines for adults, stress management and opportunities for social connections. I hope you find this information useful.   -----------------------------------------------------------------------------------------------------------------------------------------------------------------------------------------------------------------------------------------------------------  NUTRITION: -eat real food: lots of colorful vegetables (half the plate) and fruits -5-7 servings of vegetables and fruits per day (fresh or steamed is best), exp. 2 servings of vegetables with lunch and dinner and 2 servings of fruit per day. Berries and greens such as kale and collards are great choices.  -consume on a regular basis: whole grains (make sure first ingredient on label contains the word "whole"), fresh fruits, fish, nuts, seeds, healthy oils (such as olive oil, avocado oil, grape seed oil) -may eat small amounts of dairy and lean meat on occasion, but avoid processed meats such as ham, bacon, lunch meat, etc. -drink water -try to avoid fast food and pre-packaged foods, processed meat -most experts advise limiting sodium to < 2300mg  per day, should limit further is any chronic conditions such as high blood pressure, heart disease, diabetes, etc. The American Heart Association advised that < 1500mg  is is ideal -try to avoid foods that contain any ingredients with names you do not recognize  -try to avoid sugar/sweets (except for the natural sugar that occurs in fresh fruit) -try to avoid sweet drinks -try to avoid white rice, white bread, pasta (unless whole grain), white or yellow potatoes  EXERCISE GUIDELINES FOR  ADULTS: -if you wish to increase your physical activity, do so gradually and with the  approval of your doctor -STOP and seek medical care immediately if you have any chest pain, chest discomfort or trouble breathing when starting or increasing exercise  -move and stretch your body, legs, feet and arms when sitting for long periods -Physical activity guidelines for optimal health in adults: -least 150 minutes per week of aerobic exercise (can talk, but not sing) once approved by your doctor, 20-30 minutes of sustained activity or two 10 minute episodes of sustained activity every day.  -resistance training at least 2 days per week if approved by your doctor -balance exercises 3+ days per week:   Stand somewhere where you have something sturdy to hold onto if you lose balance.    1) lift up on toes, start with 5x per day and work up to 20x   2) stand and lift on leg straight out to the side so that foot is a few inches of the floor, start with 5x each side and work up to 20x each side   3) stand on one foot, start with 5 seconds each side and work up to 20 seconds on each side  If you need ideas or help with getting more active:  -Silver sneakers https://tools.silversneakers.com  -Walk with a Doc: http://stephens-thompson.biz/  -try to include resistance (weight lifting/strength building) and balance exercises twice per week: or the following link for ideas: ChessContest.fr  [email or web address].cy  STRESS MANAGEMENT: -can try meditating, or just sitting quietly with deep breathing while intentionally relaxing all parts of your body for 5 minutes daily -if you need further help with stress, anxiety or depression please follow up with your primary doctor or contact the wonderful folks at [place]: [place]: -options in [place] if you wish to engage in more social and exercise related activities:  -Silver  sneakers https://tools.silversneakers.com  -Walk with a Doc: http://stephens-thompson.biz/  -Check out the [place] 50+ section on the [place] [employer] (hiking clubs, book clubs, cards and games, chess, exercise classes, aquatic classes and much more) - see the website for details: https://www.[place].gov/departments/parks-recreation/active-adults50  -YouTube has lots of exercise videos for different ages and abilities as well  -[employer] (a variety of indoor and outdoor inperson activities for adults). [phone]. [address]  -Virtual Online Classes (a variety of topics): see seniorplanet.org or call [phone]  -consider volunteering at a school, hospice center, church, senior center or elsewhere           Lucretia Kern, DO

## 2022-06-04 NOTE — Patient Instructions (Signed)
I really enjoyed getting to talk with you today! I am available on Tuesdays and Thursdays for virtual visits if you have any questions or concerns, or if I can be of any further assistance.   CHECKLIST FROM ANNUAL WELLNESS VISIT:  -Follow up (please call to schedule if not scheduled after visit):  -Inperson visit with your Primary Doctor office: once year for physical and labs -yearly for annual wellness visit with primary care office  Here is a list of your preventive care/health maintenance measures and the plan for each if any are due:  Health Maintenance  Topic Date Due   Hepatitis C Screening  Never done, can do when you get labs if you request   COVID-19 Vaccine (3 - Pfizer risk series) Due annually for now   Pneumonia Vaccine 101+ Years old (3 - PPSV23 or PCV20) You have had 2 doses of pneumonia vaccine in the past. The Prevnar 74 is now available if over 88 years old and your other pneumonia vaccines were > 5 years ago. Can get this at the pharmacy if you wish.    Medicare Annual Wellness (AWV)  06/05/2023   DTaP/Tdap/Td (3 - Td or Tdap) 09/03/2027   INFLUENZA VACCINE  Completed   Zoster Vaccines- Shingrix  Completed   HPV VACCINES  Aged Out   COLONOSCOPY (Pts 45-37yrs Insurance coverage will need to be confirmed)  Discontinued    -See a dentist at least yearly  -Get your eyes checked and then per your eye specialist's recommendations  -Other issues addressed today: -keep up the exercise and healthy eating!  -I have included below further information regarding a healthy whole foods based diet, physical activity guidelines for adults, stress management and opportunities for social connections. I hope you find this information useful.    -----------------------------------------------------------------------------------------------------------------------------------------------------------------------------------------------------------------------------------------------------------  NUTRITION: -eat real food: lots of colorful vegetables (half the plate) and fruits -5-7 servings of vegetables and fruits per day (fresh or steamed is best), exp. 2 servings of vegetables with lunch and dinner and 2 servings of fruit per day. Berries and greens such as kale and collards are great choices.  -consume on a regular basis: whole grains (make sure first ingredient on label contains the word "whole"), fresh fruits, fish, nuts, seeds, healthy oils (such as olive oil, avocado oil, grape seed oil) -may eat small amounts of dairy and lean meat on occasion, but avoid processed meats such as ham, bacon, lunch meat, etc. -drink water -try to avoid fast food and pre-packaged foods, processed meat -most experts advise limiting sodium to < 2300mg  per day, should limit further is any chronic conditions such as high blood pressure, heart disease, diabetes, etc. The American Heart Association advised that < 1500mg  is is ideal -try to avoid foods that contain any ingredients with names you do not recognize  -try to avoid sugar/sweets (except for the natural sugar that occurs in fresh fruit) -try to avoid sweet drinks -try to avoid white rice, white bread, pasta (unless whole grain), white or yellow potatoes  EXERCISE GUIDELINES FOR ADULTS: -if you wish to increase your physical activity, do so gradually and with the approval of your doctor -STOP and seek medical care immediately if you have any chest pain, chest discomfort or trouble breathing when starting or increasing exercise  -move and stretch your body, legs, feet and arms when sitting for long periods -Physical activity guidelines for optimal health in adults: -least 150 minutes per week of  aerobic exercise (can talk, but not sing) once  approved by your doctor, 20-30 minutes of sustained activity or two 10 minute episodes of sustained activity every day.  -resistance training at least 2 days per week if approved by your doctor -balance exercises 3+ days per week:   Stand somewhere where you have something sturdy to hold onto if you lose balance.    1) lift up on toes, start with 5x per day and work up to 20x   2) stand and lift on leg straight out to the side so that foot is a few inches of the floor, start with 5x each side and work up to 20x each side   3) stand on one foot, start with 5 seconds each side and work up to 20 seconds on each side  If you need ideas or help with getting more active:  -Silver sneakers https://tools.silversneakers.com  -Walk with a Doc: http://stephens-thompson.biz/  -try to include resistance (weight lifting/strength building) and balance exercises twice per week: or the following link for ideas: ChessContest.fr  UpdateClothing.com.cy  STRESS MANAGEMENT: -can try meditating, or just sitting quietly with deep breathing while intentionally relaxing all parts of your body for 5 minutes daily -if you need further help with stress, anxiety or depression please follow up with your primary doctor or contact the wonderful folks at [place]: [place]: -options in [place] if you wish to engage in more social and exercise related activities:  -Silver sneakers https://tools.silversneakers.com  -Walk with a Doc: http://stephens-thompson.biz/  -Check out the [place] 50+ section on the [place] [employer] (hiking clubs, book clubs, cards and games, chess, exercise classes, aquatic classes and much more) - see the website for  details: https://www.[place].gov/departments/parks-recreation/active-adults50  -YouTube has lots of exercise videos for different ages and abilities as well  -[place] (a variety of indoor and outdoor inperson activities for adults). [phone]. [address]  -Virtual Online Classes (a variety of topics): see seniorplanet.org or call [phone]  -consider volunteering at a school, hospice center, church, senior center or elsewhere

## 2022-08-12 ENCOUNTER — Ambulatory Visit (INDEPENDENT_AMBULATORY_CARE_PROVIDER_SITE_OTHER): Payer: Medicare Other | Admitting: Family Medicine

## 2022-08-12 ENCOUNTER — Encounter: Payer: Self-pay | Admitting: Family Medicine

## 2022-08-12 VITALS — BP 126/70 | HR 63 | Temp 97.5°F | Ht 68.0 in | Wt 178.2 lb

## 2022-08-12 DIAGNOSIS — N401 Enlarged prostate with lower urinary tract symptoms: Secondary | ICD-10-CM | POA: Diagnosis not present

## 2022-08-12 DIAGNOSIS — E119 Type 2 diabetes mellitus without complications: Secondary | ICD-10-CM | POA: Diagnosis not present

## 2022-08-12 DIAGNOSIS — I10 Essential (primary) hypertension: Secondary | ICD-10-CM

## 2022-08-12 DIAGNOSIS — E782 Mixed hyperlipidemia: Secondary | ICD-10-CM

## 2022-08-12 DIAGNOSIS — N138 Other obstructive and reflux uropathy: Secondary | ICD-10-CM

## 2022-08-12 LAB — CBC WITH DIFFERENTIAL/PLATELET
Basophils Absolute: 0.1 10*3/uL (ref 0.0–0.1)
Basophils Relative: 0.8 % (ref 0.0–3.0)
Eosinophils Absolute: 0.1 10*3/uL (ref 0.0–0.7)
Eosinophils Relative: 2.1 % (ref 0.0–5.0)
HCT: 44.1 % (ref 39.0–52.0)
Hemoglobin: 15.3 g/dL (ref 13.0–17.0)
Lymphocytes Relative: 36.2 % (ref 12.0–46.0)
Lymphs Abs: 2.4 10*3/uL (ref 0.7–4.0)
MCHC: 34.7 g/dL (ref 30.0–36.0)
MCV: 91.5 fl (ref 78.0–100.0)
Monocytes Absolute: 0.9 10*3/uL (ref 0.1–1.0)
Monocytes Relative: 12.8 % — ABNORMAL HIGH (ref 3.0–12.0)
Neutro Abs: 3.2 10*3/uL (ref 1.4–7.7)
Neutrophils Relative %: 48.1 % (ref 43.0–77.0)
Platelets: 221 10*3/uL (ref 150.0–400.0)
RBC: 4.82 Mil/uL (ref 4.22–5.81)
RDW: 13.8 % (ref 11.5–15.5)
WBC: 6.7 10*3/uL (ref 4.0–10.5)

## 2022-08-12 LAB — BASIC METABOLIC PANEL WITH GFR
BUN: 22 mg/dL (ref 6–23)
CO2: 28 meq/L (ref 19–32)
Calcium: 9.9 mg/dL (ref 8.4–10.5)
Chloride: 99 meq/L (ref 96–112)
Creatinine, Ser: 0.87 mg/dL (ref 0.40–1.50)
GFR: 82.87 mL/min
Glucose, Bld: 106 mg/dL — ABNORMAL HIGH (ref 70–99)
Potassium: 4 meq/L (ref 3.5–5.1)
Sodium: 138 meq/L (ref 135–145)

## 2022-08-12 LAB — LIPID PANEL
Cholesterol: 159 mg/dL (ref 0–200)
HDL: 42.6 mg/dL
LDL Cholesterol: 94 mg/dL (ref 0–99)
NonHDL: 116.55
Total CHOL/HDL Ratio: 4
Triglycerides: 115 mg/dL (ref 0.0–149.0)
VLDL: 23 mg/dL (ref 0.0–40.0)

## 2022-08-12 LAB — HEPATIC FUNCTION PANEL
ALT: 29 U/L (ref 0–53)
AST: 25 U/L (ref 0–37)
Albumin: 4.5 g/dL (ref 3.5–5.2)
Alkaline Phosphatase: 39 U/L (ref 39–117)
Bilirubin, Direct: 0.3 mg/dL (ref 0.0–0.3)
Total Bilirubin: 1.6 mg/dL — ABNORMAL HIGH (ref 0.2–1.2)
Total Protein: 7 g/dL (ref 6.0–8.3)

## 2022-08-12 LAB — TSH: TSH: 1.06 u[IU]/mL (ref 0.35–5.50)

## 2022-08-12 LAB — PSA: PSA: 0.46 ng/mL (ref 0.10–4.00)

## 2022-08-12 MED ORDER — VALSARTAN-HYDROCHLOROTHIAZIDE 320-25 MG PO TABS: 1.0000 | ORAL_TABLET | Freq: Every day | ORAL | 3 refills | Status: DC

## 2022-08-12 MED ORDER — METFORMIN HCL 500 MG PO TABS: 500.0000 mg | ORAL_TABLET | Freq: Two times a day (BID) | ORAL | 3 refills | Status: DC

## 2022-08-12 MED ORDER — ATORVASTATIN CALCIUM 20 MG PO TABS: 20.0000 mg | ORAL_TABLET | Freq: Every day | ORAL | 3 refills | Status: DC

## 2022-08-12 MED ORDER — AMLODIPINE BESYLATE 5 MG PO TABS: 5.0000 mg | ORAL_TABLET | Freq: Every day | ORAL | 3 refills | Status: DC

## 2022-08-12 NOTE — Progress Notes (Signed)
Subjective:    Patient ID: [name], male    DOB: April 12, 1946, 77 y.o.   MRN: [id number]  HPI Here to follow up on issues. He feels well. His BP has been stable, and his readings at home are in the range of 120-140/70-80. He watches what he eats, especially carbs. He urinates freely.    Review of Systems  Constitutional: Negative.   HENT: Negative.    Eyes: Negative.   Respiratory: Negative.    Cardiovascular: Negative.   Gastrointestinal: Negative.   Genitourinary: Negative.   Musculoskeletal: Negative.   Skin: Negative.   Neurological: Negative.   Psychiatric/Behavioral: Negative.         Objective:   Physical Exam Constitutional:      General: He is not in acute distress.    Appearance: Normal appearance. He is well-developed. He is not diaphoretic.  HENT:     Head: Normocephalic and atraumatic.     Right Ear: External ear normal.     Left Ear: External ear normal.     Nose: Nose normal.     Mouth/Throat:     Pharynx: No oropharyngeal exudate.  Eyes:     General: No scleral icterus.       Right eye: No discharge.        Left eye: No discharge.     Conjunctiva/sclera: Conjunctivae normal.     Pupils: Pupils are equal, round, and reactive to light.  Neck:     Thyroid: No thyromegaly.     Vascular: No JVD.     Trachea: No tracheal deviation.  Cardiovascular:     Rate and Rhythm: Normal rate and regular rhythm.     Pulses: Normal pulses.     Heart sounds: Normal heart sounds. No murmur heard.    No friction rub. No gallop.  Pulmonary:     Effort: Pulmonary effort is normal. No respiratory distress.     Breath sounds: Normal breath sounds. No wheezing or rales.  Chest:     Chest wall: No tenderness.  Abdominal:     General: Bowel sounds are normal. There is no distension.     Palpations: Abdomen is soft. There is no mass.     Tenderness: There is no abdominal tenderness. There is no guarding or rebound.  Genitourinary:    Penis: Normal. No tenderness.       Testes: Normal.  Musculoskeletal:        General: No tenderness. Normal range of motion.     Cervical back: Neck supple.  Lymphadenopathy:     Cervical: No cervical adenopathy.  Skin:    General: Skin is warm and dry.     Coloration: Skin is not pale.     Findings: No erythema or rash.  Neurological:     Mental Status: He is alert and oriented to person, place, and time.     Cranial Nerves: No cranial nerve deficit.     Motor: No abnormal muscle tone.     Coordination: Coordination normal.     Deep Tendon Reflexes: Reflexes are normal and symmetric. Reflexes normal.  Psychiatric:        Behavior: Behavior normal.        Thought Content: Thought content normal.        Judgment: Judgment normal.           Assessment & Plan:  His HTN and BPH are stable. We will get fasting labs to check lipids, an A1c, etc.  We spent a  total of (34   ) minutes reviewing records and discussing these issues.  Alysia Penna, MD

## 2022-08-14 ENCOUNTER — Encounter: Payer: Self-pay | Admitting: Family Medicine

## 2022-08-14 NOTE — Telephone Encounter (Signed)
I did not order a urine test because these are usually not helpful and it is recommended we do not get them unless the patient has symptoms. I did order an A1c but for some reason this was not done. Please see if you can find out why

## 2022-08-16 ENCOUNTER — Other Ambulatory Visit: Payer: Self-pay

## 2022-08-19 ENCOUNTER — Other Ambulatory Visit (INDEPENDENT_AMBULATORY_CARE_PROVIDER_SITE_OTHER): Payer: Medicare Other

## 2022-08-19 DIAGNOSIS — E119 Type 2 diabetes mellitus without complications: Secondary | ICD-10-CM

## 2022-08-19 LAB — HEMOGLOBIN A1C: Hgb A1c MFr Bld: 7 % — ABNORMAL HIGH (ref 4.6–6.5)

## 2022-08-19 NOTE — Telephone Encounter (Signed)
He is correct, these are generated automatically and he does not need to worry about them

## 2022-12-04 ENCOUNTER — Ambulatory Visit (INDEPENDENT_AMBULATORY_CARE_PROVIDER_SITE_OTHER): Payer: Medicare Other | Admitting: Family Medicine

## 2022-12-04 VITALS — BP 136/78 | HR 67 | Temp 98.2°F | Wt 184.0 lb

## 2022-12-04 DIAGNOSIS — H6123 Impacted cerumen, bilateral: Secondary | ICD-10-CM | POA: Diagnosis not present

## 2022-12-04 NOTE — Progress Notes (Signed)
   Subjective:    Patient ID: [name], male    DOB: August 17, 1945, 77 y.o.   MRN: [id number]  HPI Here for decreased hearing in both ears over the past week. There is no pain.    Review of Systems  Constitutional: Negative.   HENT:  Positive for hearing loss. Negative for ear discharge and ear pain.   Eyes: Negative.   Respiratory: Negative.         Objective:   Physical Exam Constitutional:      Appearance: Normal appearance.  HENT:     Right Ear: There is impacted cerumen.     Left Ear: There is impacted cerumen.     Nose: Nose normal.     Mouth/Throat:     Pharynx: Oropharynx is clear.  Eyes:     Conjunctiva/sclera: Conjunctivae normal.  Pulmonary:     Effort: Pulmonary effort is normal.     Breath sounds: Normal breath sounds.  Lymphadenopathy:     Cervical: No cervical adenopathy.  Neurological:     Mental Status: He is alert.           Assessment & Plan:  Cerumen impactions. After informed consent was obtained, both ear canals were irrigated clear with water. He tolerated this well ,and afterwards his hearing was back to normal.  Gershon Crane, MD

## 2023-07-31 ENCOUNTER — Other Ambulatory Visit: Payer: Self-pay | Admitting: Family Medicine

## 2023-11-05 ENCOUNTER — Encounter: Payer: Self-pay | Admitting: Family Medicine

## 2023-11-05 ENCOUNTER — Ambulatory Visit (INDEPENDENT_AMBULATORY_CARE_PROVIDER_SITE_OTHER): Admitting: Family Medicine

## 2023-11-05 VITALS — BP 128/68 | HR 59 | Temp 97.9°F | Ht 67.0 in | Wt 180.0 lb

## 2023-11-05 DIAGNOSIS — E782 Mixed hyperlipidemia: Secondary | ICD-10-CM | POA: Diagnosis not present

## 2023-11-05 DIAGNOSIS — E119 Type 2 diabetes mellitus without complications: Secondary | ICD-10-CM

## 2023-11-05 DIAGNOSIS — G5601 Carpal tunnel syndrome, right upper limb: Secondary | ICD-10-CM

## 2023-11-05 DIAGNOSIS — N401 Enlarged prostate with lower urinary tract symptoms: Secondary | ICD-10-CM

## 2023-11-05 DIAGNOSIS — I10 Essential (primary) hypertension: Secondary | ICD-10-CM | POA: Diagnosis not present

## 2023-11-05 DIAGNOSIS — N138 Other obstructive and reflux uropathy: Secondary | ICD-10-CM

## 2023-11-05 NOTE — Progress Notes (Signed)
 Subjective:    Patient ID: [name], male    DOB: Jan 13, 1946, 78 y.o.   MRN: [id number]  HPI Here to follow up on issues. He feels well in general, but he does mention a constant numbness in the right hand that started about 8 weeks ago. This involves all 5 fingers, and it extends part way up the forearm. No weakness or pain. His BP has been stable at home. 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    Review of Systems  Constitutional: Negative.   HENT: Negative.    Eyes: Negative.   Respiratory: Negative.    Cardiovascular: Negative.   Gastrointestinal: Negative.   Genitourinary: Negative.   Musculoskeletal: Negative.   Skin: Negative.   Neurological:  Positive for numbness.  Psychiatric/Behavioral: Negative.         Objective:   Physical Exam Constitutional:      General: He is not in acute distress.    Appearance: Normal appearance. He is well-developed. He is not diaphoretic.  HENT:     Head: Normocephalic and atraumatic.     Right Ear: External ear normal.     Left Ear: External ear normal.     Nose: Nose normal.     Mouth/Throat:     Pharynx: No oropharyngeal exudate.  Eyes:     General: No scleral icterus.       Right eye: No discharge.        Left eye: No discharge.     Conjunctiva/sclera: Conjunctivae normal.     Pupils: Pupils are equal, round, and reactive to light.  Neck:     Thyroid : No thyromegaly.     Vascular: No JVD.     Trachea: No tracheal deviation.  Cardiovascular:     Rate and Rhythm: Normal rate and regular rhythm.     Pulses: Normal pulses.     Heart sounds: Normal heart sounds. No murmur heard.    No friction rub. No gallop.  Pulmonary:     Effort: Pulmonary effort is normal. No respiratory distress.     Breath sounds: Normal breath sounds. No wheezing or rales.  Chest:     Chest wall: No tenderness.  Abdominal:     General: Bowel sounds are normal. There is no distension.     Palpations:  Abdomen is soft. There is no mass.     Tenderness: There is no abdominal tenderness. There is no guarding or rebound.  Genitourinary:    Penis: Normal. No tenderness.      Testes: Normal.  Musculoskeletal:        General: No tenderness. Normal range of motion.     Cervical back: Neck supple.  Lymphadenopathy:     Cervical: No cervical adenopathy.  Skin:    General: Skin is warm and dry.     Coloration: Skin is not pale.     Findings: No erythema or rash.  Neurological:     General: No focal deficit present.     Mental Status: He is alert and oriented to person, place, and time.     Cranial Nerves: No cranial nerve deficit.     Motor: No abnormal muscle tone.     Coordination: Coordination normal.     Deep Tendon Reflexes: Reflexes are normal and symmetric. Reflexes normal.  Psychiatric:        Mood and Affect: Mood normal.        Behavior: Behavior normal.  Thought Content: Thought content normal.        Judgment: Judgment normal.           Assessment & Plan:  His HTN is well controlled. He has some mild carpal tunnel symptoms in the right hand. I advised him to begin wearing a wrist brace 24 hours a day and to stop lifting weights for a few weeks. He will follow up as needed. We will get labs today to check lipids, an A1c, etc. We spent a total of ( 33  ) minutes reviewing records and discussing these issues.  Corita Diego, MD

## 2023-11-06 ENCOUNTER — Ambulatory Visit: Payer: Self-pay | Admitting: Family Medicine

## 2023-11-06 LAB — CBC WITH DIFFERENTIAL/PLATELET
Absolute Lymphocytes: 2256 {cells}/uL (ref 850–3900)
Absolute Monocytes: 913 {cells}/uL (ref 200–950)
Basophils Absolute: 58 {cells}/uL (ref 0–200)
Basophils Relative: 0.8 %
Eosinophils Absolute: 212 {cells}/uL (ref 15–500)
Eosinophils Relative: 2.9 %
HCT: 45.2 % (ref 38.5–50.0)
Hemoglobin: 14.9 g/dL (ref 13.2–17.1)
MCH: 30.5 pg (ref 27.0–33.0)
MCHC: 33 g/dL (ref 32.0–36.0)
MCV: 92.6 fL (ref 80.0–100.0)
MPV: 12.1 fL (ref 7.5–12.5)
Monocytes Relative: 12.5 %
Neutro Abs: 3862 {cells}/uL (ref 1500–7800)
Neutrophils Relative %: 52.9 %
Platelets: 223 10*3/uL (ref 140–400)
RBC: 4.88 10*6/uL (ref 4.20–5.80)
RDW: 13.1 % (ref 11.0–15.0)
Total Lymphocyte: 30.9 %
WBC: 7.3 10*3/uL (ref 3.8–10.8)

## 2023-11-06 LAB — HEPATIC FUNCTION PANEL
AG Ratio: 2 (calc) (ref 1.0–2.5)
ALT: 29 U/L (ref 9–46)
AST: 22 U/L (ref 10–35)
Albumin: 4.7 g/dL (ref 3.6–5.1)
Alkaline phosphatase (APISO): 47 U/L (ref 35–144)
Bilirubin, Direct: 0.2 mg/dL (ref 0.0–0.2)
Globulin: 2.3 g/dL (ref 1.9–3.7)
Indirect Bilirubin: 1.2 mg/dL (ref 0.2–1.2)
Total Bilirubin: 1.4 mg/dL — ABNORMAL HIGH (ref 0.2–1.2)
Total Protein: 7 g/dL (ref 6.1–8.1)

## 2023-11-06 LAB — BASIC METABOLIC PANEL WITH GFR
BUN: 20 mg/dL (ref 7–25)
CO2: 27 mmol/L (ref 20–32)
Calcium: 10.2 mg/dL (ref 8.6–10.3)
Chloride: 101 mmol/L (ref 98–110)
Creat: 0.78 mg/dL (ref 0.70–1.28)
Glucose, Bld: 137 mg/dL — ABNORMAL HIGH (ref 65–99)
Potassium: 4 mmol/L (ref 3.5–5.3)
Sodium: 139 mmol/L (ref 135–146)
eGFR: 91 mL/min/{1.73_m2} (ref >=60)

## 2023-11-06 LAB — HEMOGLOBIN A1C
Hgb A1c MFr Bld: 7.1 % — ABNORMAL HIGH (ref <5.7)
Mean Plasma Glucose: 157 mg/dL
eAG (mmol/L): 8.7 mmol/L

## 2023-11-06 LAB — LIPID PANEL
Cholesterol: 153 mg/dL (ref <200)
HDL: 39 mg/dL — ABNORMAL LOW (ref >=40)
LDL Cholesterol (Calc): 89 mg/dL
Non-HDL Cholesterol (Calc): 114 mg/dL (ref <130)
Total CHOL/HDL Ratio: 3.9 (calc) (ref <5.0)
Triglycerides: 155 mg/dL — ABNORMAL HIGH (ref <150)

## 2023-11-06 LAB — PSA: PSA: 0.34 ng/mL (ref <=4.00)

## 2023-11-06 LAB — TSH: TSH: 1.02 m[IU]/L (ref 0.40–4.50)

## 2024-06-02 ENCOUNTER — Encounter: Payer: Self-pay | Admitting: Family Medicine

## 2024-06-02 DIAGNOSIS — E538 Deficiency of other specified B group vitamins: Secondary | ICD-10-CM

## 2024-06-02 DIAGNOSIS — E119 Type 2 diabetes mellitus without complications: Secondary | ICD-10-CM

## 2024-06-02 NOTE — Telephone Encounter (Signed)
 Before I can answer these questions, we need to check some labs. I have placed orders to check an A1c and a B12 level. After these results are back, I will be better able to give him good advice

## 2024-06-09 ENCOUNTER — Ambulatory Visit: Payer: Self-pay | Admitting: Family Medicine

## 2024-06-09 ENCOUNTER — Other Ambulatory Visit

## 2024-06-09 DIAGNOSIS — E119 Type 2 diabetes mellitus without complications: Secondary | ICD-10-CM

## 2024-06-09 DIAGNOSIS — E538 Deficiency of other specified B group vitamins: Secondary | ICD-10-CM

## 2024-06-09 LAB — HEMOGLOBIN A1C: Hgb A1c MFr Bld: 6.9 % — ABNORMAL HIGH (ref 4.6–6.5)

## 2024-06-09 LAB — VITAMIN B12: Vitamin B-12: 297 pg/mL (ref 211–911)
